# Patient Record
Sex: Female | Born: 1985 | Race: White | Hispanic: No | Smoking: Current every day smoker
Health system: Southern US, Community
[De-identification: ages and names within clinical notes are randomized; demographics above are authoritative.]

## PROBLEM LIST (undated history)

## (undated) ENCOUNTER — Inpatient Hospital Stay (HOSPITAL_COMMUNITY): Payer: Self-pay

## (undated) DIAGNOSIS — F419 Anxiety disorder, unspecified: Secondary | ICD-10-CM

## (undated) DIAGNOSIS — R519 Headache, unspecified: Secondary | ICD-10-CM

## (undated) DIAGNOSIS — F329 Major depressive disorder, single episode, unspecified: Secondary | ICD-10-CM

## (undated) DIAGNOSIS — B999 Unspecified infectious disease: Secondary | ICD-10-CM

## (undated) DIAGNOSIS — R87629 Unspecified abnormal cytological findings in specimens from vagina: Secondary | ICD-10-CM

## (undated) DIAGNOSIS — R87619 Unspecified abnormal cytological findings in specimens from cervix uteri: Secondary | ICD-10-CM

## (undated) DIAGNOSIS — IMO0002 Reserved for concepts with insufficient information to code with codable children: Secondary | ICD-10-CM

## (undated) DIAGNOSIS — R51 Headache: Secondary | ICD-10-CM

## (undated) DIAGNOSIS — F32A Depression, unspecified: Secondary | ICD-10-CM

## (undated) HISTORY — PX: WISDOM TOOTH EXTRACTION: SHX21

## (undated) HISTORY — DX: Unspecified abnormal cytological findings in specimens from cervix uteri: R87.619

## (undated) HISTORY — DX: Reserved for concepts with insufficient information to code with codable children: IMO0002

## (undated) HISTORY — PX: INDUCED ABORTION: SHX677

---
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
## 2005-04-13 ENCOUNTER — Other Ambulatory Visit: Admission: RE | Admit: 2005-04-13 | Discharge: 2005-04-13 | Payer: Self-pay | Admitting: Obstetrics and Gynecology

## 2005-05-19 ENCOUNTER — Emergency Department (HOSPITAL_COMMUNITY): Admission: EM | Admit: 2005-05-19 | Discharge: 2005-05-19 | Payer: Self-pay | Admitting: Emergency Medicine

## 2006-06-24 ENCOUNTER — Emergency Department (HOSPITAL_COMMUNITY): Admission: EM | Admit: 2006-06-24 | Discharge: 2006-06-24 | Payer: Self-pay | Admitting: Emergency Medicine

## 2007-07-04 ENCOUNTER — Other Ambulatory Visit: Admission: RE | Admit: 2007-07-04 | Discharge: 2007-07-04 | Payer: Self-pay | Admitting: Obstetrics and Gynecology

## 2007-12-29 ENCOUNTER — Emergency Department (HOSPITAL_COMMUNITY): Admission: EM | Admit: 2007-12-29 | Discharge: 2007-12-29 | Payer: Self-pay | Admitting: Emergency Medicine

## 2008-10-02 ENCOUNTER — Inpatient Hospital Stay (HOSPITAL_COMMUNITY): Admission: AD | Admit: 2008-10-02 | Discharge: 2008-10-02 | Payer: Self-pay | Admitting: Obstetrics & Gynecology

## 2008-10-09 ENCOUNTER — Other Ambulatory Visit: Admission: RE | Admit: 2008-10-09 | Discharge: 2008-10-09 | Payer: Self-pay | Admitting: Obstetrics and Gynecology

## 2009-02-01 ENCOUNTER — Inpatient Hospital Stay (HOSPITAL_COMMUNITY): Admission: AD | Admit: 2009-02-01 | Discharge: 2009-02-01 | Payer: Self-pay | Admitting: Obstetrics and Gynecology

## 2009-04-29 ENCOUNTER — Inpatient Hospital Stay (HOSPITAL_COMMUNITY): Admission: AD | Admit: 2009-04-29 | Discharge: 2009-05-03 | Payer: Self-pay | Admitting: Obstetrics and Gynecology

## 2009-09-28 ENCOUNTER — Emergency Department (HOSPITAL_COMMUNITY): Admission: EM | Admit: 2009-09-28 | Discharge: 2009-09-28 | Payer: Self-pay | Admitting: Emergency Medicine

## 2010-12-15 ENCOUNTER — Emergency Department (HOSPITAL_COMMUNITY)
Admission: EM | Admit: 2010-12-15 | Discharge: 2010-12-15 | Disposition: A | Discharge status: Home / Self Care | Payer: Self-pay | Attending: Emergency Medicine | Admitting: Emergency Medicine

## 2010-12-15 DIAGNOSIS — J069 Acute upper respiratory infection, unspecified: Secondary | ICD-10-CM | POA: Insufficient documentation

## 2010-12-15 DIAGNOSIS — J309 Allergic rhinitis, unspecified: Secondary | ICD-10-CM | POA: Insufficient documentation

## 2010-12-26 LAB — CBC
Hemoglobin: 9.9 g/dL — ABNORMAL LOW (ref 12.0–15.0)
MCHC: 34.2 g/dL (ref 30.0–36.0)
MCHC: 35 g/dL (ref 30.0–36.0)
MCV: 94.6 fL (ref 78.0–100.0)
Platelets: 184 10*3/uL (ref 150–400)
RBC: 2.99 MIL/uL — ABNORMAL LOW (ref 3.87–5.11)
RBC: 3.91 MIL/uL (ref 3.87–5.11)
RDW: 13.1 % (ref 11.5–15.5)

## 2010-12-29 LAB — URINALYSIS, ROUTINE W REFLEX MICROSCOPIC
Bilirubin Urine: NEGATIVE
Glucose, UA: NEGATIVE mg/dL
Ketones, ur: NEGATIVE mg/dL
pH: 6 (ref 5.0–8.0)

## 2011-01-04 LAB — URINALYSIS, ROUTINE W REFLEX MICROSCOPIC
Glucose, UA: NEGATIVE mg/dL
Hgb urine dipstick: NEGATIVE
Specific Gravity, Urine: 1.02 (ref 1.005–1.030)
Urobilinogen, UA: 0.2 mg/dL (ref 0.0–1.0)

## 2011-02-02 NOTE — H&P (Signed)
Selena Wang, Selena Wang         ACCOUNT NO.:  [id number]   MEDICAL RECORD NO.:  [id number]          PATIENT TYPE:  INP   LOCATION:  9172                          FACILITY:  [facility]   PHYSICIAN:  Gerald Leitz, MD          DATE OF BIRTH:  09-09-1986   DATE OF ADMISSION:  04/29/2009  DATE OF DISCHARGE:                              HISTORY & PHYSICAL   The patient scheduled for admission on April 29, 2009.   HISTORY OF PRESENT ILLNESS:  This is a 25 year old G2, P 0-0-1-0 at 40  weeks 6 days.  Intrauterine pregnancy based on 12-week ultrasound with  estimated date of delivery April 23, 2009.  Pregnancy was complicated by  tobacco use and depression and anxiety.  She reports positive fetal  movement.  No leakage of fluid.  No vaginal bleeding.  No irregular  contractions.   PAST MEDICAL HISTORY:  Depression and anxiety.   PAST SURGICAL HISTORY:  D&C in 2006 for elective AB.   PAST OB HISTORY:  Elective AB in 2006.   MEDICATIONS:  Prenatal vitamins and Zoloft.   ALLERGIES:  ALLEGRA-D, which causes palpitations.   PAST GYN HISTORY:  ASCUS Pap in January 2010.  Positive HPV.   SOCIAL HISTORY:  The patient is married.  Positive tobacco use,  approximately half pack per day.  Denies alcohol or illicit drug use.   REVIEW OF SYSTEMS:  Negative except as stated history of current  illness.   PHYSICAL EXAMINATION:  VITAL SIGNS:  Blood pressure 120/70, weight 176  pounds.  GENERAL:  Alert and oriented no acute distress.  CARDIOVASCULAR:  Regular rate and rhythm.  LUNGS:  Clear to auscultation bilaterally.  ABDOMEN:  Gravid, nontender.  EXTREMITIES:  1+ edema bilaterally.  Cervix closed.  Fetal heart rate 148.   LABS:  Blood type O+, GBS is negative.  Gonorrhea, Chlamydia negative,  HIV is nonreactive.  RPR nonreactive.  Glucola 87.  Rubella immune.   ASSESSMENT/PLAN:  A 40-week 6-day intrauterine pregnancy, post dates for  Cytotec induction.  GBS negative.  Anticipate spontaneous  vaginal  delivery.      Gerald Leitz, MD  Electronically Signed     TC/MEDQ  D:  04/29/2009  T:  04/29/2009  Job:  [id number]

## 2011-06-15 LAB — COMPREHENSIVE METABOLIC PANEL
ALT: 13
AST: 30
Alkaline Phosphatase: 68
GFR calc Af Amer: >60
Glucose, Bld: 81
Potassium: 4
Sodium: 136
Total Protein: 6.8

## 2011-06-15 LAB — POCT PREGNANCY, URINE
Operator id: 234501
Preg Test, Ur: NEGATIVE

## 2011-06-15 LAB — URINALYSIS, ROUTINE W REFLEX MICROSCOPIC
Bilirubin Urine: NEGATIVE
Glucose, UA: NEGATIVE
Hgb urine dipstick: NEGATIVE
Ketones, ur: NEGATIVE
Protein, ur: NEGATIVE
pH: 7

## 2011-06-15 LAB — CBC
Hemoglobin: 14.1
RBC: 4.56
RDW: 12

## 2011-06-15 LAB — DIFFERENTIAL
Basophils Relative: 0
Eosinophils Absolute: 0.2
Eosinophils Relative: 2
Monocytes Absolute: 0.6
Monocytes Relative: 8
Neutrophils Relative %: 54

## 2011-07-13 ENCOUNTER — Emergency Department (HOSPITAL_COMMUNITY)
Admission: EM | Admit: 2011-07-13 | Discharge: 2011-07-14 | Disposition: A | Discharge status: Home / Self Care | Payer: Self-pay | Attending: Emergency Medicine | Admitting: Emergency Medicine

## 2011-07-13 DIAGNOSIS — L723 Sebaceous cyst: Secondary | ICD-10-CM | POA: Insufficient documentation

## 2011-07-13 DIAGNOSIS — F17200 Nicotine dependence, unspecified, uncomplicated: Secondary | ICD-10-CM | POA: Insufficient documentation

## 2011-07-13 DIAGNOSIS — L708 Other acne: Secondary | ICD-10-CM | POA: Insufficient documentation

## 2012-03-06 ENCOUNTER — Encounter (HOSPITAL_COMMUNITY): Payer: Self-pay | Admitting: Emergency Medicine

## 2012-03-06 ENCOUNTER — Emergency Department (HOSPITAL_COMMUNITY)
Admission: EM | Admit: 2012-03-06 | Discharge: 2012-03-06 | Disposition: A | Discharge status: Home / Self Care | Payer: Self-pay | Attending: Emergency Medicine | Admitting: Emergency Medicine

## 2012-03-06 DIAGNOSIS — N390 Urinary tract infection, site not specified: Secondary | ICD-10-CM | POA: Insufficient documentation

## 2012-03-06 DIAGNOSIS — F17200 Nicotine dependence, unspecified, uncomplicated: Secondary | ICD-10-CM | POA: Insufficient documentation

## 2012-03-06 LAB — URINALYSIS, ROUTINE W REFLEX MICROSCOPIC
Bilirubin Urine: NEGATIVE
Glucose, UA: NEGATIVE mg/dL
Ketones, ur: NEGATIVE mg/dL
Specific Gravity, Urine: 1.013 (ref 1.005–1.030)
pH: 6.5 (ref 5.0–8.0)

## 2012-03-06 LAB — URINE MICROSCOPIC-ADD ON

## 2012-03-06 MED ORDER — NITROFURANTOIN MONOHYD MACRO 100 MG PO CAPS: 100.0000 mg | ORAL_CAPSULE | Freq: Two times a day (BID) | ORAL | Status: AC

## 2012-03-06 NOTE — ED Notes (Signed)
Pt presenting to ed with c/o lower abdominal pain and dysuria. Pt states urinary urgency and frequency. Pt states she took over the counter medicine on Friday and Saturday and had some relief but now the pain is back . Pt states she had her period one week ago and today she is seeing a small amount of blood when she wipes

## 2012-03-06 NOTE — Discharge Instructions (Signed)

## 2012-03-06 NOTE — ED Provider Notes (Signed)
History     CSN: [id number]  Arrival date & time 03/06/12  1224   First MD Initiated Contact with Patient 03/06/12 1648      Chief Complaint  Patient presents with  . Dysuria    (Consider location/radiation/quality/duration/timing/severity/associated sxs/prior treatment) Patient is a 26 y.o. female presenting with dysuria. The history is provided by the patient.  Dysuria  This is a new problem. Episode onset: 5 days ago. The problem occurs every urination. The problem has been gradually worsening. The quality of the pain is described as burning. There has been no fever. There is no history of pyelonephritis. Associated symptoms include frequency and hematuria. Pertinent negatives include no chills, no nausea, no vomiting, no discharge, no possible pregnancy, no urgency and no flank pain. Treatments tried: OTC Urostat. Her past medical history does not include kidney stones or recurrent UTIs.    History reviewed. No pertinent past medical history.  History reviewed. No pertinent past surgical history.  No family history on file.  History  Substance Use Topics  . Smoking status: Current Everyday Smoker -- 1.0 packs/day  . Smokeless tobacco: Not on file  . Alcohol Use: Yes     occassion    OB History    Grav Para Term Preterm Abortions TAB SAB Ect Mult Living                  Review of Systems  Constitutional: Negative for fever and chills.  Gastrointestinal: Negative for nausea and vomiting.  Genitourinary: Positive for dysuria, frequency and hematuria. Negative for urgency, flank pain, decreased urine volume, vaginal bleeding, vaginal discharge, difficulty urinating and vaginal pain.  Neurological: Negative for dizziness, syncope and light-headedness.  Psychiatric/Behavioral: Negative for confusion.    Allergies  Review of patient's allergies indicates no known allergies.  Home Medications   Current Outpatient Rx  Name Route Sig Dispense Refill  . ADULT  MULTIVITAMIN W/MINERALS CH Oral Take 1 tablet by mouth daily.      BP 109/78  Pulse 98  Temp 98.5 F (36.9 C) (Oral)  Resp 15  SpO2 100%  LMP 02/26/2012  Physical Exam  Nursing note and vitals reviewed. Constitutional: She appears well-developed and well-nourished. No distress.  HENT:  Head: Normocephalic and atraumatic.  Mouth/Throat: Oropharynx is clear and moist.  Neck: Normal range of motion. Neck supple.  Cardiovascular: Normal rate, regular rhythm and normal heart sounds.   Pulmonary/Chest: Effort normal and breath sounds normal.  Abdominal: Soft. Bowel sounds are normal. She exhibits no mass. There is no rigidity, no rebound, no guarding, no CVA tenderness, no tenderness at McBurney's point and negative Murphy's sign.       Mild suprapubic tenderness to palpation  Neurological: She is alert.  Skin: Skin is warm and dry. She is not diaphoretic.  Psychiatric: She has a normal mood and affect.    ED Course  Procedures (including critical care time)  Labs Reviewed  URINALYSIS, ROUTINE W REFLEX MICROSCOPIC - Abnormal; Notable for the following:    APPearance CLOUDY (*)     Hgb urine dipstick LARGE (*)     Leukocytes, UA LARGE (*)     All other components within normal limits  POCT PREGNANCY, URINE  URINE MICROSCOPIC-ADD ON   No results found.   No diagnosis found.    MDM  Patient presenting with dysuria, increased urinary frequency, and mild suprapubic abdominal tenderness.  Patient afebrile.  No CVA tenderness.  UA demonstrates UTI.  Patient given antibiotic prescription.  Return precautions  discussed.          Pascal Lux [place], PA-C 03/06/12 2256

## 2012-03-06 NOTE — ED Provider Notes (Signed)
Medical screening examination/treatment/procedure(s) were performed by non-physician practitioner and as supervising physician I was immediately available for consultation/collaboration.   Keegan Bensch, MD 03/06/12 2351 

## 2012-03-06 NOTE — ED Notes (Signed)
Pt c/o urinary tract infection symptoms. States that she is having lower abdominal pain, pain upon urination, and bleeding when she wipes. States LMP was 02/26/12. Denies nausea/vomiting and fever.

## 2012-03-08 LAB — URINE CULTURE

## 2012-03-09 NOTE — ED Notes (Signed)
+   Urine Patient treated with Macrobid to same-chart appended per protocol MD. 

## 2013-02-15 ENCOUNTER — Inpatient Hospital Stay (HOSPITAL_COMMUNITY)
Admission: AD | Admit: 2013-02-15 | Discharge: 2013-02-16 | Disposition: A | Discharge status: Home / Self Care | Payer: Self-pay | Source: Ambulatory Visit | Attending: Family Medicine | Admitting: Family Medicine

## 2013-02-15 ENCOUNTER — Inpatient Hospital Stay (HOSPITAL_COMMUNITY): Payer: Self-pay

## 2013-02-15 ENCOUNTER — Encounter (HOSPITAL_COMMUNITY): Payer: Self-pay | Admitting: *Deleted

## 2013-02-15 DIAGNOSIS — O209 Hemorrhage in early pregnancy, unspecified: Secondary | ICD-10-CM | POA: Insufficient documentation

## 2013-02-15 DIAGNOSIS — R109 Unspecified abdominal pain: Secondary | ICD-10-CM | POA: Insufficient documentation

## 2013-02-15 LAB — CBC
HCT: 40.9 % (ref 36.0–46.0)
Hemoglobin: 14.5 g/dL (ref 12.0–15.0)
MCHC: 35.5 g/dL (ref 30.0–36.0)
MCV: 90.5 fL (ref 78.0–100.0)
RDW: 12.6 % (ref 11.5–15.5)

## 2013-02-15 LAB — POCT PREGNANCY, URINE: Preg Test, Ur: POSITIVE — AB

## 2013-02-15 NOTE — MAU Note (Signed)
Pt states she had a positive pregnancy test, and bloody mucousnoted prior to her coming her tonight Monday morning pain  was horrible.

## 2013-02-15 NOTE — MAU Note (Signed)
Pt LMP 01/13/2013, +UPT at home, abd pain on Monday, today passing bloody mucous.  Taking pain medication and abx for facial abcess removal.

## 2013-02-16 DIAGNOSIS — O209 Hemorrhage in early pregnancy, unspecified: Secondary | ICD-10-CM

## 2013-02-16 LAB — WET PREP, GENITAL
Clue Cells Wet Prep HPF POC: NONE SEEN
Yeast Wet Prep HPF POC: NONE SEEN

## 2013-02-16 NOTE — MAU Provider Note (Signed)
History     CSN: [id number]  Arrival date and time: 02/15/13 1955   None     Chief Complaint  Patient presents with  . Possible Pregnancy  . Abdominal Pain  . Vaginal Bleeding   HPI This is a 27 y.o. female at Unknown Gestation who presents with c/o blood stained mucous. Has had some pelvic cramping that comes and goes. Denies nausea or vomiting. Denies fever  RN Note: Pt states she had a positive pregnancy test, and bloody mucousnoted prior to her coming her tonight Monday morning pain was horrible.      OB History   Grav Para Term Preterm Abortions TAB SAB Ect Mult Living   3 1 1  0 0 0 0 0 0 1      Past Medical History  Diagnosis Date  . Medical history non-contributory     Past Surgical History  Procedure Laterality Date  . No past surgeries      Family History  Problem Relation Age of Onset  . Depression Mother   . Thyroid disease Mother   . Hypertension Father     History  Substance Use Topics  . Smoking status: Current Every Day Smoker -- 1.00 packs/day  . Smokeless tobacco: Not on file  . Alcohol Use: Yes     Comment: occassion    Allergies: No Known Allergies  Prescriptions prior to admission  Medication Sig Dispense Refill  . loratadine-pseudoephedrine (CLARITIN-D 24-HOUR) 10-240 MG per 24 hr tablet Take 1 tablet by mouth daily as needed for allergies.      . naproxen (NAPROSYN) 500 MG tablet Take 500 mg by mouth 2 (two) times daily.      . Naproxen Sodium (ALEVE) 220 MG CAPS Take 2 capsules by mouth 2 (two) times daily.      Marland Kitchen sulfamethoxazole-trimethoprim (BACTRIM DS) 800-160 MG per tablet Take 1 tablet by mouth 2 (two) times daily.      . traMADol (ULTRAM) 50 MG tablet Take 50 mg by mouth every 6 (six) hours as needed for pain.        Review of Systems  Constitutional: Negative for fever, chills and malaise/fatigue.  Gastrointestinal: Positive for abdominal pain (previously, not now). Negative for nausea, vomiting, diarrhea and  constipation.  Genitourinary: Negative for dysuria.  Neurological: Negative for dizziness.   Physical Exam   Blood pressure 119/77, pulse 88, temperature 98.3 F (36.8 C), temperature source Oral, resp. rate 16, height 5\' 2"  (1.575 m), weight 68.856 kg (151 lb 12.8 oz), last menstrual period 01/13/2013.  Physical Exam  Constitutional: She is oriented to person, place, and time. She appears well-developed and well-nourished. No distress.  HENT:  Head: Normocephalic.  Cardiovascular: Normal rate.   Respiratory: Effort normal.  GI: Soft. She exhibits no distension. There is no tenderness. There is no rebound and no guarding.  Genitourinary: Vagina normal and uterus normal. No vaginal discharge (no blood seen) found.  Musculoskeletal: Normal range of motion.  Neurological: She is alert and oriented to person, place, and time.  Skin: Skin is warm and dry.  Psychiatric: She has a normal mood and affect.   Results for orders placed during the hospital encounter of 02/15/13 (from the past 24 hour(s))  POCT PREGNANCY, URINE     Status: Abnormal   Collection Time    02/15/13  9:17 PM      Result Value Range   Preg Test, Ur POSITIVE (*) NEGATIVE  HCG, QUANTITATIVE, PREGNANCY     Status: Abnormal  Collection Time    02/15/13  9:45 PM      Result Value Range   hCG, Beta Chain, Quant, S 1460 (*) <5 mIU/mL  ABO/RH     Status: None   Collection Time    02/15/13  9:45 PM      Result Value Range   ABO/RH(D) O POS    CBC     Status: None   Collection Time    02/15/13  9:45 PM      Result Value Range   WBC 9.3  4.0 - 10.5 K/uL   RBC 4.52  3.87 - 5.11 MIL/uL   Hemoglobin 14.5  12.0 - 15.0 g/dL   HCT 16.1  09.6 - 04.5 %   MCV 90.5  78.0 - 100.0 fL   MCH 32.1  26.0 - 34.0 pg   MCHC 35.5  30.0 - 36.0 g/dL   RDW 40.9  81.1 - 91.4 %   Platelets 197  150 - 400 K/uL  WET PREP, GENITAL     Status: Abnormal   Collection Time    02/16/13 12:26 AM      Result Value Range   Yeast Wet Prep HPF  POC NONE SEEN  NONE SEEN   Trich, Wet Prep NONE SEEN  NONE SEEN   Clue Cells Wet Prep HPF POC NONE SEEN  NONE SEEN   WBC, Wet Prep HPF POC FEW (*) NONE SEEN     MAU Course  Procedures  MDM US Ob Transvaginal  02/15/2013   *RADIOLOGY REPORT*  Clinical Data: Vaginal bleeding and pelvic pain.  OBSTETRIC <14 WK [place] AND TRANSVAGINAL OB US  Technique:  Both transabdominal and transvaginal ultrasound examinations were performed for complete evaluation of the gestation as well as the maternal uterus, adnexal regions, and pelvic cul-de-sac.  Transvaginal technique was performed to assess early pregnancy.  Comparison:  None.  Intrauterine gestational sac:  Visualized/normal in shape. Yolk sac: Yes Embryo: No Cardiac Activity: N/A  MSD: 3.9 mm  4 w 6 d  Maternal uterus/adnexae: No subchorionic hemorrhage is seen.  The uterus is otherwise unremarkable in appearance.  The ovaries are within normal limits.  The right ovary measures 3.9 x 2.2 x 3.6 cm, while the left ovary measures 2.9 x 2.1 x 1.7 cm. No suspicious adnexal masses are seen.  There is no evidence for ovarian torsion.  No free fluid is seen in the pelvic cul-de-sac.  IMPRESSION: Single intrauterine gestational sac seen, with a mean sac diameter of 0.4 cm, corresponding to a gestational age of [redacted] weeks 6 days. This matches the gestational age of [redacted] weeks 5 days by LMP, reflecting an estimated date of delivery of October 20, 2013.  The embryo is not yet visible.   Original Report Authenticated By: Tonia Ghent, M.D.    Assessment and Plan  A:  SIUP with yolk sac seen      First trimester bleeding     P:  Discharge home       Cultures sent       Encouraged to seek Prenatal care       Proof of pregnancy letter given  [facility] 02/16/2013, 12:32 AM

## 2013-02-16 NOTE — MAU Provider Note (Signed)
Chart reviewed and agree with management and plan.  

## 2013-03-24 ENCOUNTER — Encounter (HOSPITAL_COMMUNITY): Payer: Self-pay | Admitting: *Deleted

## 2013-03-24 ENCOUNTER — Inpatient Hospital Stay (HOSPITAL_COMMUNITY): Payer: Medicaid Other

## 2013-03-24 ENCOUNTER — Inpatient Hospital Stay (HOSPITAL_COMMUNITY)
Admission: AD | Admit: 2013-03-24 | Discharge: 2013-03-24 | Disposition: A | Discharge status: Home / Self Care | Payer: Medicaid Other | Source: Ambulatory Visit | Attending: Obstetrics & Gynecology | Admitting: Obstetrics & Gynecology

## 2013-03-24 DIAGNOSIS — O035 Genital tract and pelvic infection following complete or unspecified spontaneous abortion: Secondary | ICD-10-CM | POA: Insufficient documentation

## 2013-03-24 DIAGNOSIS — O039 Complete or unspecified spontaneous abortion without complication: Secondary | ICD-10-CM

## 2013-03-24 LAB — COMPREHENSIVE METABOLIC PANEL
AST: 15 U/L (ref 0–37)
BUN: 8 mg/dL (ref 6–23)
CO2: 26 mEq/L (ref 19–32)
Calcium: 10.2 mg/dL (ref 8.4–10.5)
Chloride: 103 mEq/L (ref 96–112)
Creatinine, Ser: 0.54 mg/dL (ref 0.50–1.10)
GFR calc Af Amer: >90 mL/min (ref >90)
GFR calc non Af Amer: >90 mL/min (ref >90)
Glucose, Bld: 81 mg/dL (ref 70–99)
Total Bilirubin: 0.5 mg/dL (ref 0.3–1.2)

## 2013-03-24 LAB — CBC
HCT: 40.4 % (ref 36.0–46.0)
MCH: 31.4 pg (ref 26.0–34.0)
MCV: 88.6 fL (ref 78.0–100.0)
Platelets: 200 10*3/uL (ref 150–400)
RBC: 4.56 MIL/uL (ref 3.87–5.11)
WBC: 8 10*3/uL (ref 4.0–10.5)

## 2013-03-24 LAB — URINALYSIS, ROUTINE W REFLEX MICROSCOPIC
Leukocytes, UA: NEGATIVE
Nitrite: NEGATIVE
Protein, ur: NEGATIVE mg/dL
Specific Gravity, Urine: 1.01 (ref 1.005–1.030)
Urobilinogen, UA: 0.2 mg/dL (ref 0.0–1.0)

## 2013-03-24 LAB — WET PREP, GENITAL

## 2013-03-24 LAB — URINE MICROSCOPIC-ADD ON

## 2013-03-24 MED ORDER — IBUPROFEN 800 MG PO TABS: 800.0000 mg | ORAL_TABLET | Freq: Three times a day (TID) | ORAL | Status: DC

## 2013-03-24 MED ORDER — FLUCONAZOLE 150 MG PO TABS
150.0000 mg | ORAL_TABLET | Freq: Once | ORAL | Status: AC
  Administered 2013-03-24: 150 mg via ORAL
  Filled 2013-03-24: qty 1

## 2013-03-24 MED ORDER — PROMETHAZINE HCL 12.5 MG PO TABS: 12.5000 mg | ORAL_TABLET | Freq: Four times a day (QID) | ORAL | Status: DC | PRN

## 2013-03-24 MED ORDER — MISOPROSTOL 200 MCG PO TABS: ORAL_TABLET | ORAL | Status: DC

## 2013-03-24 MED ORDER — OXYCODONE-ACETAMINOPHEN 5-325 MG PO TABS: 1.0000 | ORAL_TABLET | ORAL | Status: DC | PRN

## 2013-03-24 MED ORDER — OXYCODONE-ACETAMINOPHEN 5-325 MG PO TABS
1.0000 | ORAL_TABLET | Freq: Once | ORAL | Status: AC
  Administered 2013-03-24: 1 via ORAL
  Filled 2013-03-24: qty 1

## 2013-03-24 NOTE — MAU Note (Signed)
Pt stated she has been bleeding for 3 days got heavier today. No cramping. No prenatal care yet. Bleeding Stared after intercourse on Thursday.

## 2013-03-24 NOTE — MAU Note (Signed)
Pt reports bleeding since Thursday. This morning pt feeling some cramping rates pain 5/10. Pt has been pt feeling dizzy for the past week.

## 2013-03-24 NOTE — MAU Provider Note (Signed)
History     CSN: [id number]  Arrival date and time: 03/24/13 1107   First Provider Initiated Contact with Patient 03/24/13 1133      Chief Complaint  Patient presents with  . Vaginal Bleeding   HPI Ms. Selena Wang is a 27 y.o. G4P1011 at [redacted]w[redacted]d who presents to MAU today with complaint of vaginal bleeding since Thursday. The patient states that she had intercourse Thursday morning and the bleeding started after that. It has been mostly brown with some red at first. She feels that it is heavier today with some small clots. She is having lower abdominal cramping that she rates at 5/10. She has not taken anything for pain. She denies fever or UTI symptoms.   OB History   Grav Para Term Preterm Abortions TAB SAB Ect Mult Living   4 1 1  0 1 1 0 0 0 1      Past Medical History  Diagnosis Date  . Medical history non-contributory     Past Surgical History  Procedure Laterality Date  . No past surgeries      Family History  Problem Relation Age of Onset  . Depression Mother   . Thyroid disease Mother   . Hypertension Father     History  Substance Use Topics  . Smoking status: Current Every Day Smoker -- 1.00 packs/day  . Smokeless tobacco: Not on file  . Alcohol Use: No     Comment: occassion    Allergies: No Known Allergies  Prescriptions prior to admission  Medication Sig Dispense Refill  . acetaminophen (TYLENOL) 500 MG tablet Take 1,000 mg by mouth every 6 (six) hours as needed for pain.      Marland Kitchen loratadine-pseudoephedrine (CLARITIN-D 24-HOUR) 10-240 MG per 24 hr tablet Take 1 tablet by mouth daily as needed for allergies.      . Prenatal Vit-Fe Fumarate-FA (PRENATAL MULTIVITAMIN) TABS Take 1 tablet by mouth daily at 12 noon.        Review of Systems  Constitutional: Negative for fever and malaise/fatigue.  Gastrointestinal: Positive for abdominal pain. Negative for nausea, vomiting, diarrhea and constipation.  Genitourinary: Negative for dysuria, urgency and  frequency.       Neg - vaginal discharge + vaginal bleeding   Physical Exam   Blood pressure 129/73, pulse 116, temperature 97.9 F (36.6 C), temperature source Oral, resp. rate 18, height 5\' 2"  (1.575 m), weight 145 lb 6.4 oz (65.953 kg), last menstrual period 01/13/2013.  Physical Exam  Constitutional: She is oriented to person, place, and time. She appears well-developed and well-nourished. No distress.  HENT:  Head: Normocephalic and atraumatic.  Cardiovascular: Normal rate, regular rhythm and normal heart sounds.   Respiratory: Effort normal and breath sounds normal. No respiratory distress.  GI: Soft. Bowel sounds are normal. She exhibits no distension and no mass. There is tenderness (mild tenderness to palpation of the lower abdomen). There is no rebound and no guarding.  Neurological: She is alert and oriented to person, place, and time.  Skin: Skin is warm and dry. No erythema.  Psychiatric: She has a normal mood and affect.   Results for orders placed during the hospital encounter of 03/24/13 (from the past 24 hour(s))  WET PREP, GENITAL     Status: Abnormal   Collection Time    03/24/13 11:40 AM      Result Value Range   Yeast Wet Prep HPF POC FEW (*) NONE SEEN   Trich, Wet Prep NONE SEEN  NONE SEEN  Clue Cells Wet Prep HPF POC NONE SEEN  NONE SEEN   WBC, Wet Prep HPF POC FEW (*) NONE SEEN  URINALYSIS, ROUTINE W REFLEX MICROSCOPIC     Status: Abnormal   Collection Time    03/24/13 11:45 AM      Result Value Range   Color, Urine YELLOW  YELLOW   APPearance CLEAR  CLEAR   Specific Gravity, Urine 1.010  1.005 - 1.030   pH 6.5  5.0 - 8.0   Glucose, UA NEGATIVE  NEGATIVE mg/dL   Hgb urine dipstick LARGE (*) NEGATIVE   Bilirubin Urine NEGATIVE  NEGATIVE   Ketones, ur NEGATIVE  NEGATIVE mg/dL   Protein, ur NEGATIVE  NEGATIVE mg/dL   Urobilinogen, UA 0.2  0.0 - 1.0 mg/dL   Nitrite NEGATIVE  NEGATIVE   Leukocytes, UA NEGATIVE  NEGATIVE  URINE MICROSCOPIC-ADD ON      Status: Abnormal   Collection Time    03/24/13 11:45 AM      Result Value Range   Squamous Epithelial / LPF FEW (*) RARE   WBC, UA 0-2  <3 WBC/hpf   RBC / HPF 3-6  <3 RBC/hpf   Bacteria, UA FEW (*) RARE  CBC     Status: None   Collection Time    03/24/13 11:55 AM      Result Value Range   WBC 8.0  4.0 - 10.5 K/uL   RBC 4.56  3.87 - 5.11 MIL/uL   Hemoglobin 14.3  12.0 - 15.0 g/dL   HCT 16.1  09.6 - 04.5 %   MCV 88.6  78.0 - 100.0 fL   MCH 31.4  26.0 - 34.0 pg   MCHC 35.4  30.0 - 36.0 g/dL   RDW 40.9  81.1 - 91.4 %   Platelets 200  150 - 400 K/uL  COMPREHENSIVE METABOLIC PANEL     Status: None   Collection Time    03/24/13 11:55 AM      Result Value Range   Sodium 139  135 - 145 mEq/L   Potassium 4.0  3.5 - 5.1 mEq/L   Chloride 103  96 - 112 mEq/L   CO2 26  19 - 32 mEq/L   Glucose, Bld 81  70 - 99 mg/dL   BUN 8  6 - 23 mg/dL   Creatinine, Ser 7.82  0.50 - 1.10 mg/dL   Calcium 95.6  8.4 - 21.3 mg/dL   Total Protein 7.1  6.0 - 8.3 g/dL   Albumin 4.3  3.5 - 5.2 g/dL   AST 15  0 - 37 U/L   ALT 13  0 - 35 U/L   Alkaline Phosphatase 82  39 - 117 U/L   Total Bilirubin 0.5  0.3 - 1.2 mg/dL   GFR calc non Af Amer >90  >90 mL/min   GFR calc Af Amer >90  >90 mL/min    MAU Course  Procedures None  MDM Wet prep, GC/Chlamydia, UA and [place] today 150 mg Diflucan given in MAU No FHTs today with doppler Discussed patient with Dr. Despina Hidden. Due to long wait time for [place] he will come to MAU and do a bedside.  Dr. Despina Hidden does not see appropriate growth or cardiac activity. Will get official US Discussed [place] results with Dr. Despina Hidden. Ok to offer expectant management vs Cytotec       Early Intrauterine Pregnancy Failure  __x_  Documented intrauterine pregnancy failure less than or equal to [redacted] weeks gestation  __x_  No  serious current illness  __x_  Baseline Hgb greater than or equal to 10g/dl  __x_  Patient has easily accessible transportation to the hospital  __x_  Clear preference  __x_   Practitioner/physician deems patient reliable  __x_  Counseling by practitioner or physician  ___  Patient education by RN  ___  Consent form signed  __N/A_  Rho-Gam given by RN if indicated  _x__ Medication dispensed   _x__   Cytotec 800 mcg  _x_   Intravaginally by patient at home         __   Intravaginally by RN in MAU        __   Rectally by patient at home        __   Rectally by RN in MAU  _x__  Ibuprofen 600 mg 1 tablet by mouth every 6 hours as needed #30  _x__  Hydrocodone/acetaminophen 5/325 mg by mouth every 4 to 6 hours as needed  _x__  Phenergan 12.5 mg by mouth every 4 hours as needed for nausea    Assessment and Plan  A: Yeast vaginitis SAB  P: Discharge home Rx for Cytotec, Ibuprofen, Phenergan and Percocet given Patient referred to GYN clinic at [facility] for follow-up in 2 weeks Bleeding precautions discussed Patient may return to MAU as needed or if her condition were to change or worsen  Freddi Starr, PA-C  03/24/2013, 1:52 PM

## 2013-03-28 ENCOUNTER — Telehealth: Payer: Self-pay | Admitting: General Practice

## 2013-03-28 ENCOUNTER — Inpatient Hospital Stay (HOSPITAL_COMMUNITY)
Admission: AD | Admit: 2013-03-28 | Discharge: 2013-03-29 | Disposition: A | Discharge status: Home / Self Care | Payer: Medicaid Other | Source: Ambulatory Visit | Attending: Obstetrics & Gynecology | Admitting: Obstetrics & Gynecology

## 2013-03-28 ENCOUNTER — Encounter (HOSPITAL_COMMUNITY): Payer: Self-pay

## 2013-03-28 ENCOUNTER — Inpatient Hospital Stay (HOSPITAL_COMMUNITY): Payer: Medicaid Other

## 2013-03-28 DIAGNOSIS — O034 Incomplete spontaneous abortion without complication: Secondary | ICD-10-CM

## 2013-03-28 LAB — CBC
HCT: 35.5 % — ABNORMAL LOW (ref 36.0–46.0)
MCH: 31.2 pg (ref 26.0–34.0)
MCV: 88.5 fL (ref 78.0–100.0)
Platelets: 183 10*3/uL (ref 150–400)
RDW: 12.2 % (ref 11.5–15.5)
WBC: 9.3 10*3/uL (ref 4.0–10.5)

## 2013-03-28 MED ORDER — OXYCODONE-ACETAMINOPHEN 5-325 MG PO TABS
2.0000 | ORAL_TABLET | Freq: Once | ORAL | Status: AC
  Administered 2013-03-28: 2 via ORAL
  Filled 2013-03-28: qty 2

## 2013-03-28 NOTE — Telephone Encounter (Signed)
Called patient back and patient stated she had already talked to someone yesterday and had already miscarried but was still having light cramping and back pain and wanted to know what she could take. Suggested she try the Ibuprofen 800mg  first and that should work. Patient verbalized understanding and asked about a follow up appt. Told patient she needed to come in for blood work to make sure the pregnancy hormone was dropping like it should be and confirmed 7/14 @ 10am appt with patient. Patient verbalized understanding and confirmed appt time. Told patient at that appt we would schedule a follow up appt with a provider. Patient verbalized understanding and had no further questions

## 2013-03-28 NOTE — MAU Provider Note (Signed)
Chief Complaint: Abdominal Pain  First Provider Initiated Contact with Patient 03/28/13 2310     SUBJECTIVE HPI: Selena Wang is a 27 y.o. G4P1021 at [redacted]w[redacted]d by [place] 02/15/13 who presents with moderate to severe cramping, vaginal bleeding and possible passage of tissue. Denies fever, chills, dizziness.  Ultrasound 03/24/2013 showed 6 weeks 1 day intrauterine pregnancy without cardiac activity visualized. An appropriate progression since previous ultrasound. Prescribe Cytotec for failed pregnancy, but patient did not take.  Past Medical History  Diagnosis Date  . Medical history non-contributory    OB History   Grav Para Term Preterm Abortions TAB SAB Ect Mult Living   4 1 1  0 2 1 1  0 0 1     # Outc Date GA Lbr Len/2nd Wgt Sex Del Anes PTL Lv   1 TAB 2006           2 TRM 2010    M    Yes   3 SAB            4 CUR              Past Surgical History  Procedure Laterality Date  . No past surgeries     History   Social History  . Marital Status: Married    Spouse Name: N/A    Number of Children: N/A  . Years of Education: N/A   Occupational History  . Not on file.   Social History Main Topics  . Smoking status: Current Every Day Smoker -- 1.00 packs/day  . Smokeless tobacco: Not on file  . Alcohol Use: No     Comment: occassion  . Drug Use: No  . Sexually Active: Yes   Other Topics Concern  . Not on file   Social History Narrative  . No narrative on file   No current facility-administered medications on file prior to encounter.   Current Outpatient Prescriptions on File Prior to Encounter  Medication Sig Dispense Refill  . acetaminophen (TYLENOL) 500 MG tablet Take 1,000 mg by mouth every 6 (six) hours as needed for pain.      Marland Kitchen ibuprofen (ADVIL,MOTRIN) 800 MG tablet Take 1 tablet (800 mg total) by mouth 3 (three) times daily.  21 tablet  0  . loratadine-pseudoephedrine (CLARITIN-D 24-HOUR) 10-240 MG per 24 hr tablet Take 1 tablet by mouth daily as needed for  allergies.      . misoprostol (CYTOTEC) 200 MCG tablet Place 4 tabs (800 mcg) in the vagina once  4 tablet  0  . Prenatal Vit-Fe Fumarate-FA (PRENATAL MULTIVITAMIN) TABS Take 1 tablet by mouth daily at 12 noon.      . promethazine (PHENERGAN) 12.5 MG tablet Take 1 tablet (12.5 mg total) by mouth every 6 (six) hours as needed for nausea.  30 tablet  0   No Known Allergies  ROS: Pertinent items in HPI  OBJECTIVE Blood pressure 111/69, pulse 94, temperature 99 F (37.2 C), resp. rate 16, height 5\' 2"  (1.575 m), weight 65.772 kg (145 lb), last menstrual period 01/13/2013, SpO2 99.00%. GENERAL: Well-developed, well-nourished female in moderate distress. Very anxious.  HEENT: Normocephalic HEART: normal rate RESP: normal effort ABDOMEN: Soft, non-tender EXTREMITIES: Nontender, no edema NEURO: Alert and oriented SPECULUM EXAM: NEFG, moderate bright red blood noted, cervix clean BIMANUAL: cervix FT; uterus slightly enlarged, no adnexal tenderness or masses  LAB RESULTS Results for orders placed during the hospital encounter of 03/28/13 (from the past 24 hour(s))  CBC     Status: Abnormal  Collection Time    03/28/13 11:05 PM      Result Value Range   WBC 9.3  4.0 - 10.5 K/uL   RBC 4.01  3.87 - 5.11 MIL/uL   Hemoglobin 12.5  12.0 - 15.0 g/dL   HCT 16.1 (*) 09.6 - 04.5 %   MCV 88.5  78.0 - 100.0 fL   MCH 31.2  26.0 - 34.0 pg   MCHC 35.2  30.0 - 36.0 g/dL   RDW 40.9  81.1 - 91.4 %   Platelets 183  150 - 400 K/uL    IMAGING US Ob Transvaginal  03/28/2013   *RADIOLOGY REPORT*  Clinical Data: Severe pain after spontaneous abortion  TRANSVAGINAL OB ULTRASOUND  Technique:  Transvaginal ultrasound was performed for evaluation of the gestation as well as the maternal uterus and adnexal regions.  Comparison: 03/24/2013  Findings: No intrauterine gestational sac is currently visualized. Endometrium is thickened and heterogeneous.  Blood flow is noted within the thickened endometrium within the  fundus suggesting retained products of conception.  Blood clot noted within the endometrium in the lower uterine segment/cervix region.  Ovaries are symmetric in size and echotexture.  No adnexal masses. No free fluid.  IMPRESSION: No intrauterine gestational sac currently visualized.  Thickened, heterogeneous endometrium.  Blood flows seen within the endometrium in the fundus suggesting retained products conception.   Original Report Authenticated By: Charlett Nose, M.D.   MAU COURSE Reviewed ultrasound results with Dr. Despina Hidden who does not agree to the ultrasound shows true routine products of conception but rather residual decidua. Offer Cytotec. Patient refuses.  ASSESSMENT 1.  Miscarriage, Possibly Incomplete     PLAN Discharge home in stable condition. Support given. Bleeding precautions.     Follow-up Information   Follow up with [facility] On 04/02/2013.   Contact information:   [address] [place] [address] [phone]      Follow up with [facility]. (As needed if symptoms worsen)    Contact information:   [address] [id number] [place] [address] [phone]       Medication List         acetaminophen 500 MG tablet  Commonly known as:  TYLENOL  Take 1,000 mg by mouth every 6 (six) hours as needed for pain.     ibuprofen 800 MG tablet  Commonly known as:  ADVIL,MOTRIN  Take 1 tablet (800 mg total) by mouth 3 (three) times daily.     loratadine-pseudoephedrine 10-240 MG per 24 hr tablet  Commonly known as:  CLARITIN-D 24-hour  Take 1 tablet by mouth daily as needed for allergies.     misoprostol 200 MCG tablet  Commonly known as:  CYTOTEC  Place 4 tabs (800 mcg) in the vagina once     oxyCODONE-acetaminophen 5-325 MG per tablet  Commonly known as:  PERCOCET/ROXICET  Take 1-2 tablets by mouth every 4 (four) hours as needed for pain.     prenatal multivitamin Tabs  Take 1 tablet  by mouth daily at 12 noon.     promethazine 12.5 MG tablet  Commonly known as:  PHENERGAN  Take 1 tablet (12.5 mg total) by mouth every 6 (six) hours as needed for nausea.        [place], CNM 03/29/2013  12:54 AM

## 2013-03-28 NOTE — Telephone Encounter (Signed)
Patient's boyfriend called and left message stating Selena Wang went to the ER on Saturday night where they confirmed a miscarriage and she was given 3 prescriptions and the pain medication Rx she was given she is almost out of and she's in bed right now with really bad contractions and is bleeding pretty bad and she would like to see about getting medicine refilled.

## 2013-03-28 NOTE — MAU Note (Signed)
Pt reports she was seen in MAU on 07/05, started having bleeding and pain then. States she miscarried yesterday. Has had severe pain all day today.

## 2013-03-28 NOTE — MAU Note (Signed)
Patient states that her vaginal bleeding is mainly in the toilet passing clots ran out of the Percocet which was helping with the pain, pain is constant.

## 2013-03-29 DIAGNOSIS — O034 Incomplete spontaneous abortion without complication: Secondary | ICD-10-CM

## 2013-03-29 MED ORDER — OXYCODONE-ACETAMINOPHEN 5-325 MG PO TABS: 1.0000 | ORAL_TABLET | ORAL | Status: DC | PRN

## 2013-04-02 ENCOUNTER — Other Ambulatory Visit: Payer: Medicaid Other

## 2013-04-02 DIAGNOSIS — O039 Complete or unspecified spontaneous abortion without complication: Secondary | ICD-10-CM

## 2013-04-03 LAB — HCG, QUANTITATIVE, PREGNANCY: hCG, Beta Chain, Quant, S: 53.9 m[IU]/mL

## 2013-04-20 ENCOUNTER — Ambulatory Visit: Payer: Medicaid Other | Admitting: Medical

## 2013-05-09 ENCOUNTER — Encounter: Payer: Self-pay | Admitting: Family Medicine

## 2013-05-09 ENCOUNTER — Other Ambulatory Visit (HOSPITAL_COMMUNITY)
Admission: RE | Admit: 2013-05-09 | Discharge: 2013-05-09 | Disposition: A | Discharge status: Home / Self Care | Payer: Medicaid Other | Source: Ambulatory Visit | Attending: Family Medicine | Admitting: Family Medicine

## 2013-05-09 ENCOUNTER — Ambulatory Visit (INDEPENDENT_AMBULATORY_CARE_PROVIDER_SITE_OTHER): Payer: Medicaid Other | Admitting: Family Medicine

## 2013-05-09 VITALS — BP 104/74 | HR 80 | Temp 97.7°F | Resp 20 | Ht 62.0 in | Wt 147.9 lb

## 2013-05-09 DIAGNOSIS — Z01419 Encounter for gynecological examination (general) (routine) without abnormal findings: Secondary | ICD-10-CM | POA: Insufficient documentation

## 2013-05-09 DIAGNOSIS — O039 Complete or unspecified spontaneous abortion without complication: Secondary | ICD-10-CM

## 2013-05-09 DIAGNOSIS — Z309 Encounter for contraceptive management, unspecified: Secondary | ICD-10-CM

## 2013-05-09 DIAGNOSIS — Z113 Encounter for screening for infections with a predominantly sexual mode of transmission: Secondary | ICD-10-CM | POA: Insufficient documentation

## 2013-05-09 MED ORDER — NORGESTIMATE-ETH ESTRADIOL 0.25-35 MG-MCG PO TABS: 1.0000 | ORAL_TABLET | Freq: Every day | ORAL | Status: DC

## 2013-05-09 NOTE — Progress Notes (Signed)
Subjective:     Patient ID: Selena Wang, female   DOB: Aug 21, 1986, 27 y.o.   MRN: [id number]  HPI  27 yo [id number] here for f/u of SAB  - was seen in the MAU 7/9 for bleeding, pain and passing tissue and concern for SAB - [place] 03/28/13 showed No intrauterine gestational sac currently visualized. Thickened, heterogeneous endometrium. Blood flows seen within the endometrium in the fundus suggesting retained products conception. Reviewed by Dr. Despina Hidden and felt to be more decidua than retained products. Was offered cytotec at this time but pt declined - states she thinks she passed the actual fetus the next day- looked like a baby - bleeding then quickly subsided.  - beta hCG had diminished at time of MAU visit  - since then has been doing well - bleeding subsided within 2 days.  - then had a normal cycle on 04/26/13 - although was slightly heavier - same duration  Since then has done well.  States has come to grips with the miscarriage.  Wants to discuss contraception Sexually active with one partner- no concerns for stds but would like to be checked for gc/chl to be safe.  Also would like PAP smear as had not had one in >3 years.   No fevers chills, weight loss, headaches, cp, sob, vaginal discharge.      Review of Systems See above    Objective:   Physical Exam BP 104/74  Pulse 80  Temp(Src) 97.7 F (36.5 C) (Oral)  Resp 20  Ht 5\' 2"  (1.575 m)  Wt 147 lb 14.4 oz (67.087 kg)  BMI 27.04 kg/m2  LMP 04/26/2013  Breastfeeding? Unknown GENERAL: Well-developed, well-nourished female in no acute distress.  ABDOMEN: Soft, nontender, nondistended. No organomegaly. PELVIC: Normal external female genitalia. Vagina is pink and rugated.  Normal discharge. Normal cervix contour- os closed. No vaginal bleeding. Pap smear obtained. Uterus is normal in size. No adnexal mass or tenderness.  EXTREMITIES: No cyanosis, clubbing, or edema, 2+ distal pulses.     Assessment:     Routine  gynecological examination - Plan: Cytology - PAP  Contraception management  Spontaneous abortion in first trimester      Plan:     1) SAB -now complete - os closed - normal cycles again - pt doing well emotionally  2) routine gyn - PAP obtained - gc/chl also   3) contraception Reviewed all forms of birth control options available including abstinence; over the counter/barrier methods; hormonal contraceptive medication including pill, patch, ring, Depo-Provera injection, Nexplanon; Mirena and Paragard IUDs;  Risks and benefits reviewed.  Questions were answered.  - pt requested OCPs - rx provided - she will start with next period - slight increased risk of blood clot due to smoking discussed.   F/u in 3 months if problems with OCP or one year otherwise.

## 2013-05-09 NOTE — Patient Instructions (Signed)

## 2013-05-24 ENCOUNTER — Telehealth: Payer: Self-pay | Admitting: General Practice

## 2013-05-24 NOTE — Telephone Encounter (Signed)
Message copied by Kathee Delton on Thu May 24, 2013  9:04 AM ------      Message from: Odelia Gage A      Created: Thu May 24, 2013  7:45 AM        Her appointment is 10/16/@ 1:15            Thanks      ----- Message -----         From: Faith Rogue Rash, LPN         Sent: 05/15/2013   8:52 AM           To: [facility] Admin Pool                        ----- Message -----         From: Vale Haven, MD         Sent: 05/14/2013  10:46 AM           To: [facility] Clinical Pool            Pt needs colposcopy.  Ladies, will you call her and schedule this? Thanks!       ------

## 2013-05-24 NOTE — Telephone Encounter (Signed)
Called patient, no answer on mobile and I was unable to leave a message due to voicemail box had not been set up. Called patient at home number and some lady answered stating she was not there at the moment and didn't know a number to reach her.

## 2013-05-25 NOTE — Telephone Encounter (Signed)
Called patient and informed her of results and need for colposcopy and explained procedure. Patient verbalized understanding. Informed patient of 10/16 @ 1:15 appt. Patient verbalized understanding and had no further questions

## 2013-07-05 ENCOUNTER — Encounter: Payer: Self-pay | Admitting: Family Medicine

## 2013-07-05 ENCOUNTER — Ambulatory Visit (INDEPENDENT_AMBULATORY_CARE_PROVIDER_SITE_OTHER): Payer: Medicaid Other | Admitting: Family Medicine

## 2013-07-05 ENCOUNTER — Other Ambulatory Visit (HOSPITAL_COMMUNITY)
Admission: RE | Admit: 2013-07-05 | Discharge: 2013-07-05 | Disposition: A | Discharge status: Home / Self Care | Payer: Medicaid Other | Source: Ambulatory Visit | Attending: Family Medicine | Admitting: Family Medicine

## 2013-07-05 VITALS — BP 122/85 | HR 87 | Temp 98.4°F | Wt 154.5 lb

## 2013-07-05 DIAGNOSIS — IMO0002 Reserved for concepts with insufficient information to code with codable children: Secondary | ICD-10-CM

## 2013-07-05 DIAGNOSIS — Z01812 Encounter for preprocedural laboratory examination: Secondary | ICD-10-CM

## 2013-07-05 DIAGNOSIS — R6889 Other general symptoms and signs: Secondary | ICD-10-CM

## 2013-07-05 DIAGNOSIS — N870 Mild cervical dysplasia: Secondary | ICD-10-CM | POA: Insufficient documentation

## 2013-07-05 NOTE — Patient Instructions (Signed)
Colposcopy  Care After  Colposcopy is a procedure in which a special tool is used to magnify the surface of the cervix. A tissue sample (biopsy) may also be taken. This sample will be looked at for cervical cancer or other problems. After the test:  · You may have some cramping.  · Lie down for a few minutes if you feel lightheaded.  ·  You may have some bleeding which should stop in a few days.  HOME CARE  · Do not have sex or use tampons for 2 to 3 days or as told.  · Only take medicine as told by your doctor.  · Continue to take your birth control pills as usual.  Finding out the results of your test  Ask when your test results will be ready. Make sure you get your test results.  GET HELP RIGHT AWAY IF:  · You are bleeding a lot or are passing blood clots.  · You develop a fever of 102° F (38.9° C) or higher.  · You have abnormal vaginal discharge.  · You have cramps that do not go away with medicine.  · You feel lightheaded, dizzy, or pass out (faint).  MAKE SURE YOU:   · Understand these instructions.  · Will watch your condition.  · Will get help right away if you are not doing well or get worse.  Document Released: 02/23/2008 Document Revised: 11/29/2011 Document Reviewed: 02/23/2008  ExitCare® Patient Information ©2014 [employer].

## 2013-07-05 NOTE — Progress Notes (Signed)
GYNECOLOGY CLINIC PROCEDURE NOTE  27 y.o. [id number] here for colposcopy for low-grade squamous intraepithelial neoplasia (LGSIL - encompassing HPV,mild dysplasia,CIN I) pap smear on 05/09/13. Discussed role for HPV in cervical dysplasia, need for surveillance.  Patient given informed consent, signed copy in the chart, time out was performed.  Placed in lithotomy position. Cervix viewed with speculum and colposcope after application of acetic acid.   Colposcopy adequate? Yes  no mosaicism, no punctation, no abnormal vasculature and acetowhite lesion(s) noted at 6 and 12 o'clock; biopsies obtained.  ECC specimen obtained. All specimens were labelled and sent to pathology.  Patient was given post procedure instructions.  Will follow up pathology and manage accordingly.  Routine preventative health maintenance measures emphasized.

## 2013-07-11 ENCOUNTER — Encounter: Payer: Self-pay | Admitting: *Deleted

## 2013-08-03 ENCOUNTER — Encounter: Payer: Medicaid Other | Admitting: Family Medicine

## 2013-09-07 ENCOUNTER — Encounter: Payer: Self-pay | Admitting: Family Medicine

## 2013-09-07 ENCOUNTER — Ambulatory Visit (INDEPENDENT_AMBULATORY_CARE_PROVIDER_SITE_OTHER): Payer: Medicaid Other | Admitting: Family Medicine

## 2013-09-07 VITALS — BP 126/81 | HR 116 | Temp 97.6°F | Ht 62.0 in | Wt 161.9 lb

## 2013-09-07 DIAGNOSIS — N870 Mild cervical dysplasia: Secondary | ICD-10-CM

## 2013-09-07 DIAGNOSIS — N644 Mastodynia: Secondary | ICD-10-CM

## 2013-09-07 DIAGNOSIS — N871 Moderate cervical dysplasia: Secondary | ICD-10-CM

## 2013-09-07 LAB — POCT PREGNANCY, URINE: Preg Test, Ur: NEGATIVE

## 2013-09-07 MED ORDER — NORGESTIM-ETH ESTRAD TRIPHASIC 0.18/0.215/0.25 MG-25 MCG PO TABS: 1.0000 | ORAL_TABLET | Freq: Every day | ORAL | Status: DC

## 2013-09-07 NOTE — Patient Instructions (Addendum)
[facility] [phone]  Cervical cryotherapy is a procedure which involves freezing an area of abnormal tissue on the cervix. This tissue gradually disappears and the cervix heals. One cervical cryotherapy is usually sufficient to destroy the abnormal tissue.  Purpose  Cervical cryotherapy is a standard method used to treat cervical dysplasia, meaning the removal of abnormal cell tissue on the cervix.  Description  Cervical cryotherapy, or freezing, usually lasts about five minutes and causes a slight amount of discomfort. The procedure is usually performed in an outpatient setting.  Cervical cryotherapy is done by placing a small freeze-probe (cryoprobe) against the cervix that cools the cervix to sub-zero temperatures. The cells destroyed by freezing are shed afterwards in a heavy watery discharge. The main advantage of cryotherapy is that it is a simple procedure that requires inexpensive equipment.  The cryogenic device consists of a gas tank containing a refrigerant and non-explosive, non-toxic gas (usually nitrous oxide). The gas is delivered using flexible tubing through a gun-type attachment to the cryoprobe.  Diagnosis/Preparation  Women who undergo cervical cryotherapy typically have had an abnormal Pap smear which has led to a diagnosis of cervical squamous dysplasia and usually confirmed by biopsy after an adequate colposcopic exam.  Preparation for cervical cryotherapy involves scheduling the procedure when the patient is not experiencing heavy menstrual flow. Ibuprofen or naproxen sodium may be given before cryotherapy to decrease cramping. If there is any doubt about the pregnancy status, a pregnancy test is performed.  Aftercare  Cervical cryotherapy is often followed by a heavy and often odorous discharge during the first month after the procedure. The discharge is due to the dead tissue cells leaving the treatment site. The patient should abstain from sexual  intercourse and not use tampons for a period of two weeks after the procedure. Excessive exercise should also be avoided to lessen the occurrence of post-therapy bleeding.  Risks  The following risks have been associated with cervical cryotherapy:  Uterine cramping. Often occurs during the cryotherapy but rapidly subsides after treatment.  Bleeding and infection. Rare, but incidences have been reported.  More difficult Pap smears. Future Pap smears and colposcopy may be more difficult after cryotherapy.  Normal results  A normal result is no recurrence of the abnormal cervix cells. The first follow-up Pap smear is done at 12 months then 24 months after the procedure. If normal, patients resume usual pap smear screening.  If any, recurrences usually occur within two years of treatment.  If a follow-up Pap smear is abnormal, a colposcopy with biopsy is usually performed. Other treatment methods, usually the loop electrocautery excision procedure (LEEP) are then used if persistent disease is discovered.  Following the procedure, it is considered normal to experience the following:  slight cramping for two to three days  watery discharge requiring several pad changes daily  Bloody or brown discharge, especially 12-16 days after the procedure  Alternatives  Loop electrocautery excision procedure (LEEP). This procedure uses a fine wire loop with an electric current flowing through it to remove the desired area of the cervix. Loop excision is usually done under local anesthesia and causes very little discomfort.

## 2013-09-07 NOTE — Progress Notes (Signed)
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  Here today for cryotherapy.   Also, having 5 months of breast tenderness and wondering what can be done. No fevers, discharge, erythema.   Cryotherapy details The indications for cryotherapy were reviewed with the patient in detail. She was counseled about that efficacy of this procedure, and possible need for excisional procedure in the future if her cervical dysplasia persists.  The risks of the procedure where explained in detail and patient was told to expect a copious amount of discharge in the next few weeks. All her questions were answered, and written informed consent was obtained.  The patient was placed in the dorsal lithotomy position and a vaginal speculum was placed. Her cervix was visualized and patient was noted to have had normal size transformation zone. The appropriate cryotherapy probe was picked and affixed to cryotherapy apparatus. Then nitrogen gas was then activated, the probe was coated with lubricating jelly and applied to the transformation zone of the cervix. This was kept in place for 3 minutes. The cryotherapy was then stopped and all instruments were removed from the patient's pelvis; a thawing period of 3 minutes was observed.  A second cycle of cryotherapy was then administered to the cervix for 3 minutes.  The patient tolerated the procedure well without any complications. Routine post procedure instructions were given to the patient.  Will follow up results and manage accordingly to ASCCP guidelines which specify co-testing with pap and HPV typing at 12 and 24 months.   A/p Breast tenderness - trial x 3 months of OCPs to regulate hormone levels - suspect too high of a progesterone level post her miscarriage.    F/u in 1 year for cotesting.   Shevawn Langenberg, Redmond Baseman, MD

## 2013-09-17 ENCOUNTER — Encounter: Payer: Self-pay | Admitting: *Deleted

## 2013-10-01 ENCOUNTER — Telehealth: Payer: Self-pay | Admitting: *Deleted

## 2013-10-01 NOTE — Telephone Encounter (Signed)
Patient had cryo in December she was concerned that she is still having a lot of discharge. I advised that this is considered an expected side effect. Patient also reports that she had bad period cramps this past cycle. I told her that this was probably not related. She has a followup scheduled here in a few weeks, I recommended that she keep this appt and if she had worsening symptoms or developed fever with pelvic pain to go to MAU. Patient agrees with plan.

## 2013-10-01 NOTE — Telephone Encounter (Signed)
Patient left message that she had a procedure in December and thinks she is having complications now.

## 2013-10-11 ENCOUNTER — Telehealth: Payer: Self-pay | Admitting: General Practice

## 2013-10-11 NOTE — Telephone Encounter (Signed)
Patient called and left message that she recently started taking birth control and has a question for a nurse, would like call back. Called patient and she stated that she recently started taking the birth control pills 10 days ago and now she is spotting and having cramps. Asked patient when her most recent LMP was and she said the Monday before she started the pills on Sunday. Told patient that since she just started taking the pills it can cause her to have irregular bleeding/spotting for the first few months until her body gets used to the hormones and thus cramping can happen. Suggested patient try taking ibuprofen/motrin 600mg  every 6-8 hours as needed for the cramping, but that she should not experience severe pain or heavy bleeding. Patient verbalized understanding and had no further questions

## 2013-10-15 ENCOUNTER — Ambulatory Visit (INDEPENDENT_AMBULATORY_CARE_PROVIDER_SITE_OTHER): Payer: Medicaid Other | Admitting: Family Medicine

## 2013-10-15 ENCOUNTER — Encounter: Payer: Self-pay | Admitting: Family Medicine

## 2013-10-15 VITALS — BP 117/80 | HR 81 | Temp 97.5°F | Ht 62.0 in | Wt 162.2 lb

## 2013-10-15 DIAGNOSIS — N921 Excessive and frequent menstruation with irregular cycle: Secondary | ICD-10-CM

## 2013-10-15 DIAGNOSIS — N898 Other specified noninflammatory disorders of vagina: Secondary | ICD-10-CM

## 2013-10-15 NOTE — Progress Notes (Signed)
S:  28 yo [id number] here for several concerns.   1) hx of cryotherapy - done 12/19 - had sex two days later and noticed that her discharge seemed to worsen - now discharge seems to be improving but still has some occasionally - slight spotting post intercourse - wondering if this is normal - mostly wanting reassurance that having sex 2 days after cryo didn't hurt something.  No fevers, bloating, heaving bleeding, diarrhea, constipation, dysuria.   2) period - was started on OCPs at last visit for breast tenderness and concern given waniting time before next pregnancy - says that is now into 3rd week of first pill pack. Started spotting this last week - breast tenderness improved but wondering what to do about spotting - still not to placebo pills Bleeding currently  No HA, nausea, fevers.   O: Filed Vitals:   10/15/13 1510  BP: 117/80  Pulse: 81  Temp: 97.5 F (36.4 C)  Height: 5\' 2"  (1.575 m)  Weight: 73.573 kg (162 lb 3.2 oz)   GEN: NAD, well appearing LUNGS: normal effort CV: RRR ABD: soft NT, ND.  GU: deferred  A/P  28 yo here for above.   1) discharge since cryotherapy - reassurance - vaginal bleeding also normal post intercourse as has just been 5 weeks - should this continue to next month, recommend she return for exam - currently menstruating so difficult to ascertain degree of symptoms.   2) breakthrough bleeding - midcycle bleeding - could be due to low estrogen component but suspect more related to new OCP start - recommend one more month of trial and if still having breakthrough bleeding, will plan to increase estrogen component.   F/u in 1 month if needed.   Rileyann Florance, Redmond BasemanKELI L, MD

## 2013-10-15 NOTE — Progress Notes (Signed)
States here because continues to have problems since crytotheraphy- having brownish or yellowish discharge.  Also period came early.

## 2013-11-08 ENCOUNTER — Telehealth: Payer: Self-pay | Admitting: General Practice

## 2013-11-08 NOTE — Telephone Encounter (Signed)
Pt called and stated that she is having problems with her BC.

## 2013-11-14 NOTE — Telephone Encounter (Signed)
Called pt and discussed her concern. She stated that she has had breakthrough bleeding for the past 2 months but it has only lasted for 1 day. I advised pt that this is wnl for now as her body is probably just getting [facility]. If this continues, she should let us know as it may be necessary to change her prescription to another pill.  Pt voiced understanding.

## 2014-01-03 ENCOUNTER — Telehealth: Payer: Self-pay | Admitting: *Deleted

## 2014-01-03 NOTE — Telephone Encounter (Addendum)
Pt left message stating that she was @ Urgent Care and there is a concern that she may be allergic to her birth control pills. Please call back. 4/17  0950  Called pt and discussed her concern. She states that she has noticed some bumps on the back of her tongue and went to [facility] [address] for [address]. The problem started 1 week ago. She was given a 6 day course of steroids. She was also told to contact us to discuss the possibility of allergic reaction to her birth control pills. She has been taking the OCP's since December. Pt denies using any new oral products (toothpaste, mouthwash) or eating any new foods. She denies having oral sex. She reports seasonal allergies for which she takes Claritin.  I advised her that an allergy to OCP's is highly unlikely because they are made of hormones which occur naturally in females. Also she has been taking them for over 3 months and an allergic reaction would normally occur rather quickly. I advised her to get a new toothbrush, continue taking Claritin and steroids as prescribed and to call us back if the problem persists. Pt voiced understanding.

## 2014-05-23 IMAGING — US US OB TRANSVAGINAL
1 series · 14 of 28 positions shown · non-contrast
Comparison: 03/24/2013

CLINICAL DATA: Severe pain after spontaneous abortion

TRANSVAGINAL OB ULTRASOUND
TECHNIQUE: Transvaginal ultrasound was performed for evaluation of
the gestation as well as the maternal uterus and adnexal regions.

[Series 1: us ob transvaginal · 29 acquisitions, 14 frames shown]
[im 2/29]
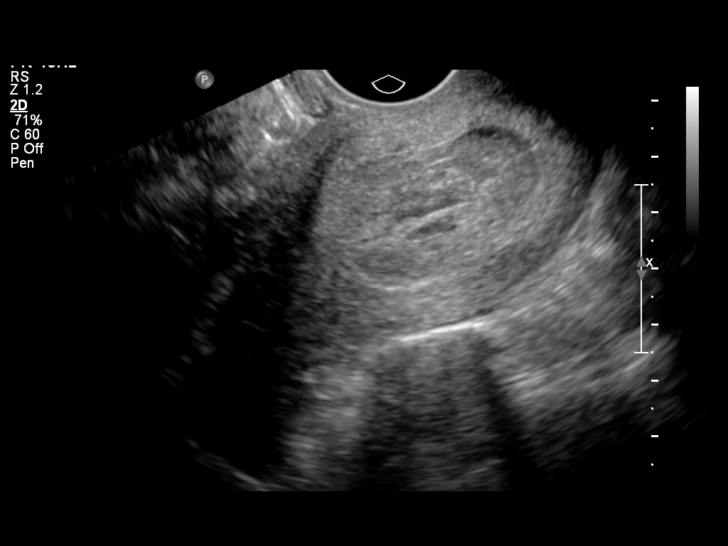
[im 4/29]
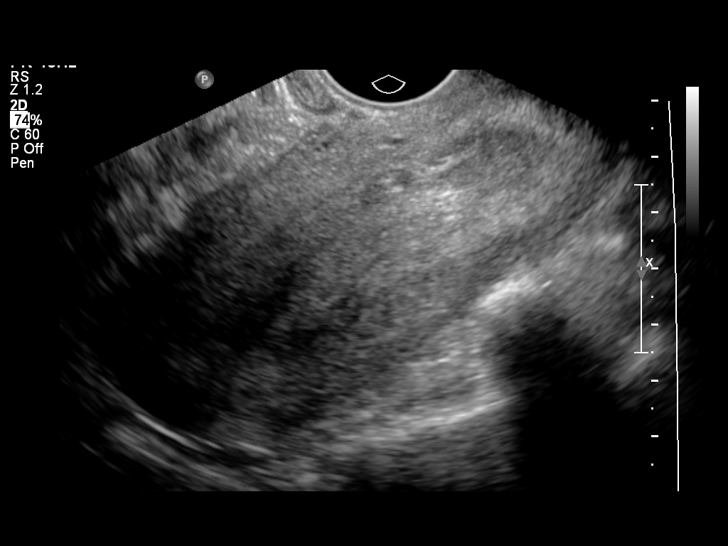
[im 6/29]
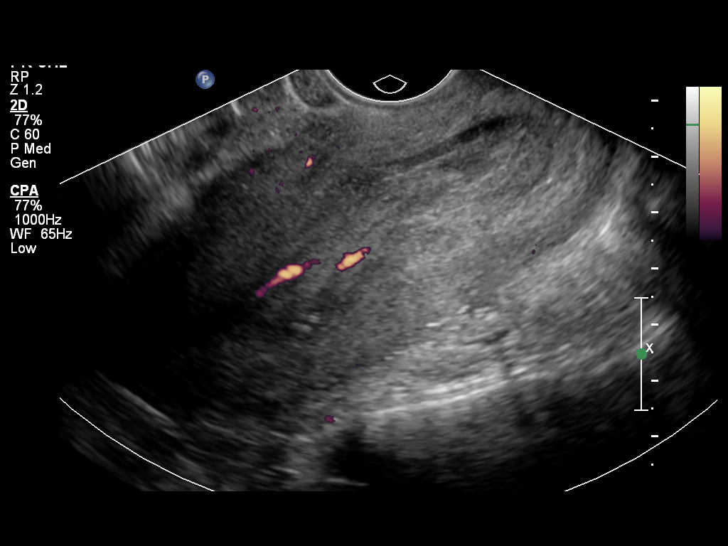
[im 8/29]
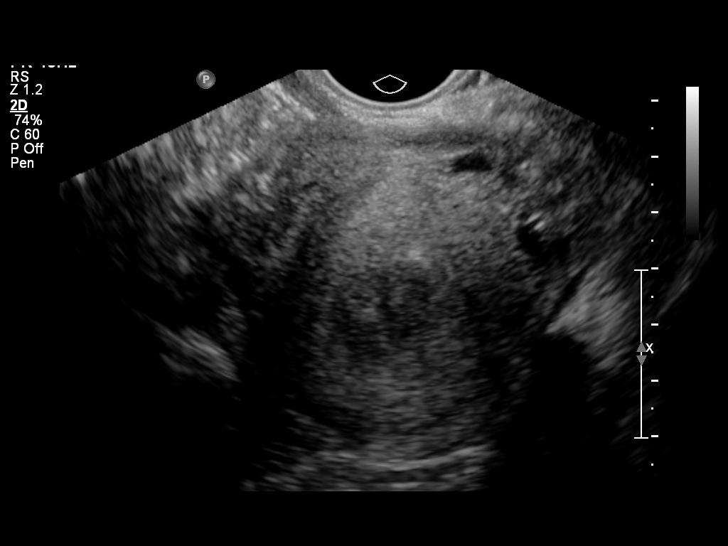
[im 10/29]
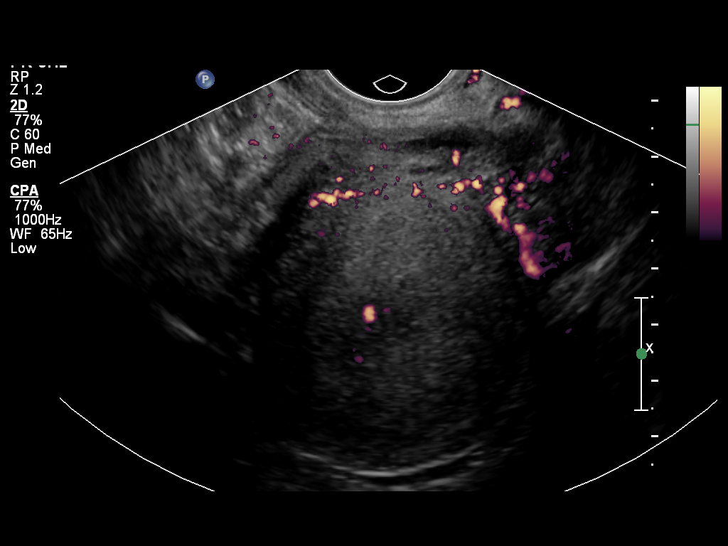
[im 12/29]
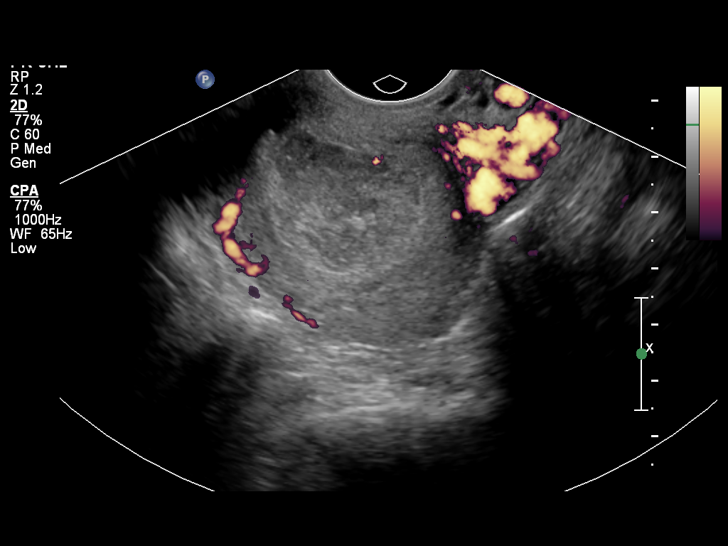
[im 14/29]
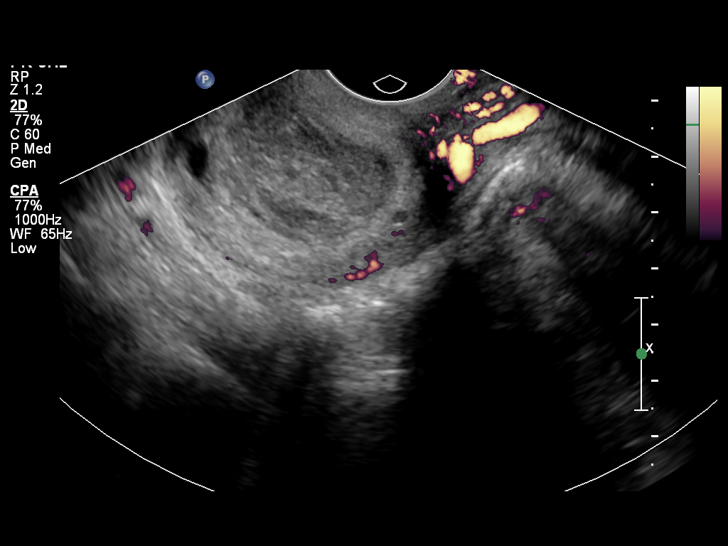
[im 16/29]
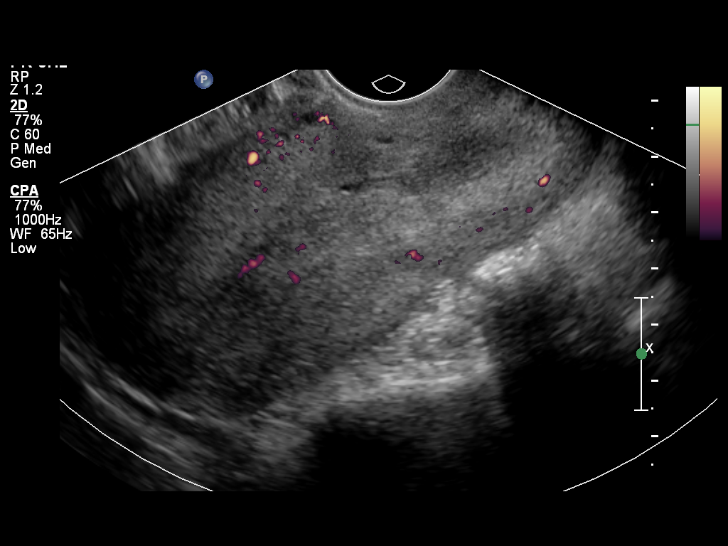
[im 18/29]
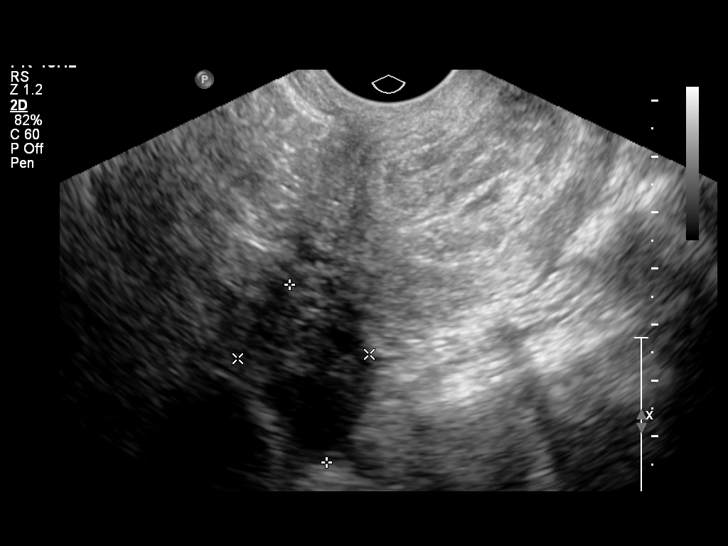
[im 20/29]
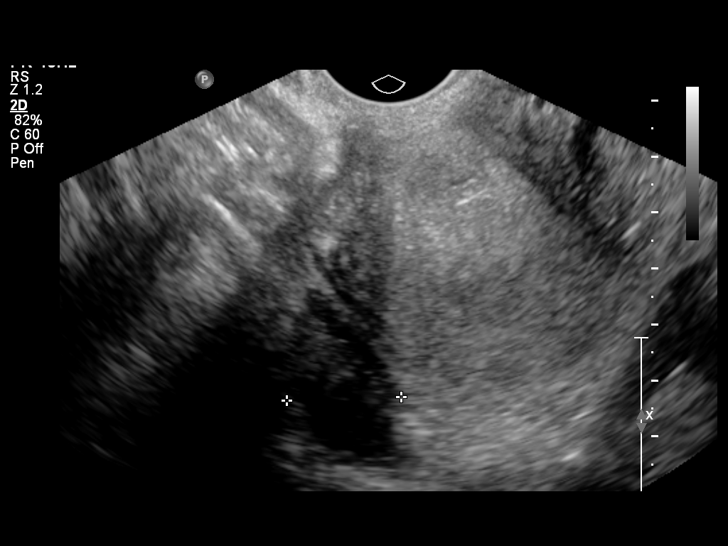
[im 22/29]
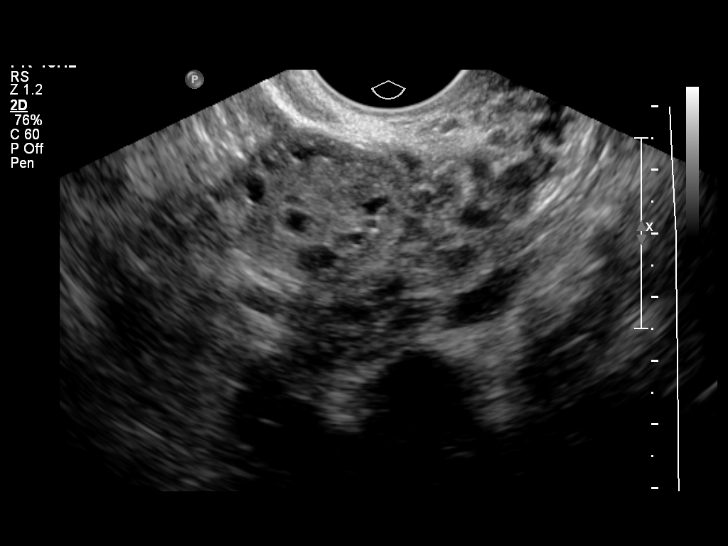
[im 24/29]
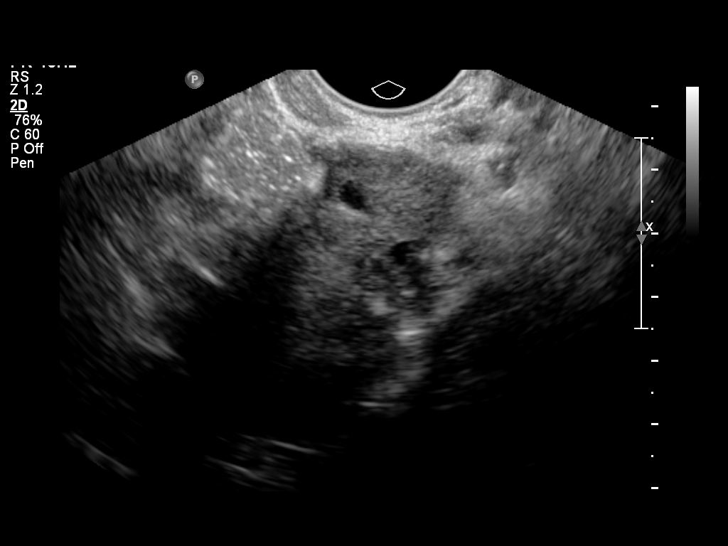
[im 26/29]
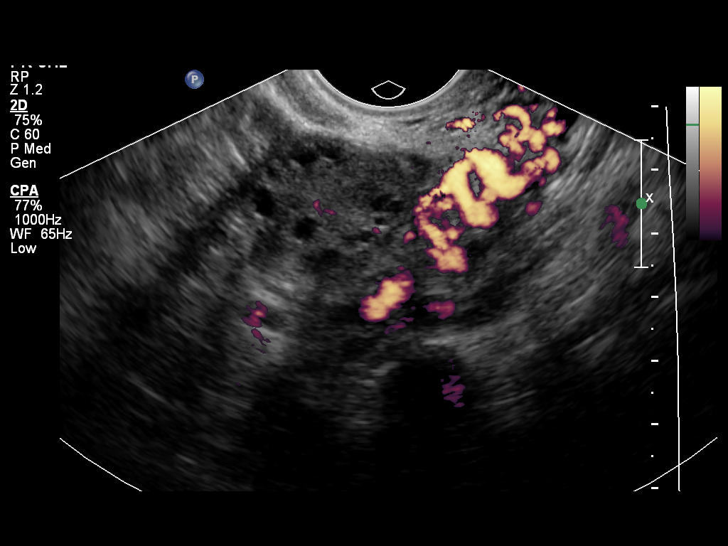
[im 29/29]
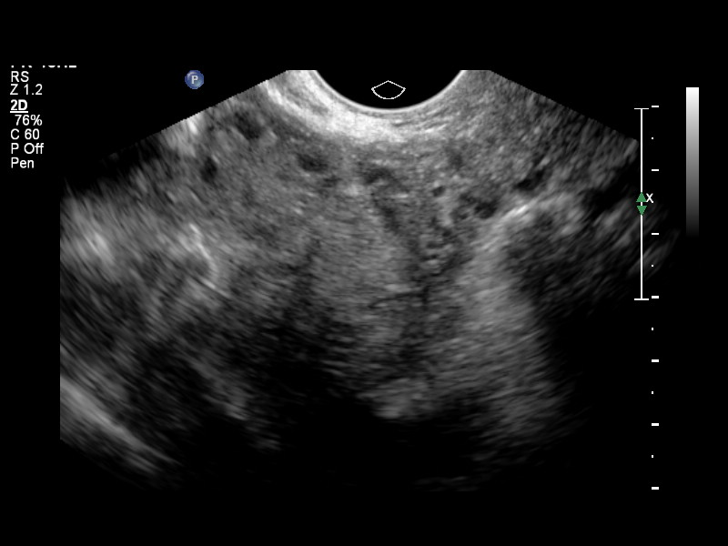

[14 of 28 positions shown; findings below may reference images not displayed]

FINDINGS: No intrauterine gestational sac is currently visualized.
Endometrium is thickened and heterogeneous.  Blood flow is noted
within the thickened endometrium within the fundus suggesting
retained products of conception.  Blood clot noted within the
endometrium in the lower uterine segment/cervix region.

Ovaries are symmetric in size and echotexture.  No adnexal masses.
No free fluid.
IMPRESSION: No intrauterine gestational sac currently visualized.  Thickened,
heterogeneous endometrium.  Blood flows seen within the endometrium
in the fundus suggesting retained products conception.

## 2014-07-05 ENCOUNTER — Encounter (HOSPITAL_COMMUNITY): Payer: Self-pay | Admitting: *Deleted

## 2014-07-05 ENCOUNTER — Inpatient Hospital Stay (HOSPITAL_COMMUNITY)
Admission: AD | Admit: 2014-07-05 | Discharge: 2014-07-05 | Disposition: A | Discharge status: Home / Self Care | Payer: Medicaid Other | Source: Ambulatory Visit | Attending: Obstetrics & Gynecology | Admitting: Obstetrics & Gynecology

## 2014-07-05 DIAGNOSIS — O99330 Smoking (tobacco) complicating pregnancy, unspecified trimester: Secondary | ICD-10-CM | POA: Insufficient documentation

## 2014-07-05 DIAGNOSIS — F17210 Nicotine dependence, cigarettes, uncomplicated: Secondary | ICD-10-CM | POA: Diagnosis not present

## 2014-07-05 DIAGNOSIS — Z3A00 Weeks of gestation of pregnancy not specified: Secondary | ICD-10-CM | POA: Diagnosis not present

## 2014-07-05 DIAGNOSIS — R109 Unspecified abdominal pain: Secondary | ICD-10-CM | POA: Diagnosis present

## 2014-07-05 DIAGNOSIS — N939 Abnormal uterine and vaginal bleeding, unspecified: Secondary | ICD-10-CM

## 2014-07-05 DIAGNOSIS — O209 Hemorrhage in early pregnancy, unspecified: Secondary | ICD-10-CM | POA: Diagnosis not present

## 2014-07-05 HISTORY — DX: Headache, unspecified: R51.9

## 2014-07-05 HISTORY — DX: Unspecified abnormal cytological findings in specimens from vagina: R87.629

## 2014-07-05 HISTORY — DX: Anxiety disorder, unspecified: F41.9

## 2014-07-05 HISTORY — DX: Depression, unspecified: F32.A

## 2014-07-05 HISTORY — DX: Headache: R51

## 2014-07-05 HISTORY — DX: Unspecified infectious disease: B99.9

## 2014-07-05 HISTORY — DX: Major depressive disorder, single episode, unspecified: F32.9

## 2014-07-05 LAB — URINALYSIS, ROUTINE W REFLEX MICROSCOPIC
Bilirubin Urine: NEGATIVE
Glucose, UA: NEGATIVE mg/dL
KETONES UR: NEGATIVE mg/dL
Leukocytes, UA: NEGATIVE
Nitrite: NEGATIVE
PROTEIN: NEGATIVE mg/dL
Specific Gravity, Urine: <1.005 — ABNORMAL LOW (ref 1.005–1.030)
Urobilinogen, UA: 0.2 mg/dL (ref 0.0–1.0)
pH: 5.5 (ref 5.0–8.0)

## 2014-07-05 LAB — WET PREP, GENITAL
Clue Cells Wet Prep HPF POC: NONE SEEN
Trich, Wet Prep: NONE SEEN
Yeast Wet Prep HPF POC: NONE SEEN

## 2014-07-05 LAB — HIV ANTIBODY (ROUTINE TESTING W REFLEX): HIV 1&2 Ab, 4th Generation: NONREACTIVE

## 2014-07-05 LAB — URINE MICROSCOPIC-ADD ON

## 2014-07-05 LAB — POCT PREGNANCY, URINE: PREG TEST UR: NEGATIVE

## 2014-07-05 LAB — HCG, QUANTITATIVE, PREGNANCY: hCG, Beta Chain, Quant, S: 3 m[IU]/mL (ref <5)

## 2014-07-05 NOTE — MAU Note (Signed)
Patient states she has had positive home pregnancy tests. Started having abdominal cramping and spotting today. Some nausea, no vomiting this am.

## 2014-07-05 NOTE — MAU Note (Signed)
Pt reports 2 +HPT, test here was neg.

## 2014-07-05 NOTE — MAU Note (Signed)
Bleeding noted when used restroom this morning.  Seems to be getting heavier.  Cramping in lower abd and back started about the same time.

## 2014-07-05 NOTE — MAU Provider Note (Signed)
History     CSN: [id number]  Arrival date and time: 07/05/14 [id number]   None     Chief Complaint  Patient presents with  . Possible Pregnancy  . Abdominal Cramping  . Vaginal Bleeding   HPI Selena BatemanJessica Wang 28 y.o. [id number] presents to MAU with bleeding and cramping.  She took 2 home pregnancy tests last week that were both positive.  Today she began bleeding and having cramping.  LMP was 06/01/14 and was a regular period.  She has recently had vaginal discharge.  She noted pain in the vagina the same day she took the HPT.  She denies nausea, vomiting, fever, dysuria.  She is not on contraceptive.   OB History   Grav Para Term Preterm Abortions TAB SAB Ect Mult Living   3 1 1  0 2 1 1  0 0 1      Past Medical History  Diagnosis Date  . Medical history non-contributory   . Abnormal Pap smear   . Headache   . Infection     UTI  . Vaginal Pap smear, abnormal     colpo  . Anxiety   . Depression     Past Surgical History  Procedure Laterality Date  . No past surgeries    . Wisdom tooth extraction      age 28  . Induced abortion      Family History  Problem Relation Age of Onset  . Depression Mother   . Thyroid disease Mother   . Anxiety disorder Mother   . Hypertension Father   . Depression Father   . Anxiety disorder Father   . Depression Sister   . Anxiety disorder Sister   . Hearing loss Maternal Grandmother     age related  . Cancer Paternal Grandmother     History  Substance Use Topics  . Smoking status: Current Every Day Smoker -- 1.00 packs/day for 12 years    Types: Cigarettes  . Smokeless tobacco: Never Used  . Alcohol Use: No     Comment: occassion    Allergies: No Known Allergies  Prescriptions prior to admission  Medication Sig Dispense Refill  . acetaminophen (TYLENOL) 325 MG tablet Take 650 mg by mouth every 6 (six) hours as needed for headache.      . ibuprofen (ADVIL,MOTRIN) 200 MG tablet Take 400-600 mg by mouth every 6 (six) hours as  needed for headache (depends on pain if takes 2 or 3 tablets).      . loratadine (CLARITIN) 10 MG tablet Take 10 mg by mouth daily.        ROS Pertinent ROS in HPI Physical Exam   Blood pressure 125/84, pulse 103, temperature 99.1 F (37.3 C), temperature source Oral, resp. rate 16, height 5\' 3"  (1.6 m), weight 166 lb 12.8 oz (75.66 kg), last menstrual period 06/01/2014, SpO2 99.00%.  Physical Exam  Constitutional: She is oriented to person, place, and time. She appears well-developed and well-nourished.  HENT:  Head: Normocephalic and atraumatic.  Eyes: EOM are normal.  Neck: Normal range of motion.  Cardiovascular: Normal rate, regular rhythm and normal heart sounds.   Respiratory: Effort normal and breath sounds normal. No respiratory distress.  GI: Soft. Bowel sounds are normal. She exhibits no distension. There is no tenderness. There is no rebound.  Genitourinary:  NO fluid/discharge/bleeding noted externally.   Mod amt of dark red blood pooled in vagina but no additional blood observed to be coming from os.  No discrete discharge seen.  Cervix is closed with no CMP/adnexal mass or tenderness  Musculoskeletal: Normal range of motion.  Neurological: She is alert and oriented to person, place, and time.  Skin: Skin is warm and dry.  Psychiatric: She has a normal mood and affect.   Results for orders placed during the hospital encounter of 07/05/14 (from the past 24 hour(s))  URINALYSIS, ROUTINE W REFLEX MICROSCOPIC     Status: Abnormal   Collection Time    07/05/14  9:40 AM      Result Value Ref Range   Color, Urine YELLOW  YELLOW   APPearance CLEAR  CLEAR   Specific Gravity, Urine <1.005 (*) 1.005 - 1.030   pH 5.5  5.0 - 8.0   Glucose, UA NEGATIVE  NEGATIVE mg/dL   Hgb urine dipstick LARGE (*) NEGATIVE   Bilirubin Urine NEGATIVE  NEGATIVE   Ketones, ur NEGATIVE  NEGATIVE mg/dL   Protein, ur NEGATIVE  NEGATIVE mg/dL   Urobilinogen, UA 0.2  0.0 - 1.0 mg/dL   Nitrite  NEGATIVE  NEGATIVE   Leukocytes, UA NEGATIVE  NEGATIVE  URINE MICROSCOPIC-ADD ON     Status: None   Collection Time    07/05/14  9:40 AM      Result Value Ref Range   Squamous Epithelial / LPF RARE  RARE   RBC / HPF 0-2  <3 RBC/hpf  WET PREP, GENITAL     Status: Abnormal   Collection Time    07/05/14 10:45 AM      Result Value Ref Range   Yeast Wet Prep HPF POC NONE SEEN  NONE SEEN   Trich, Wet Prep NONE SEEN  NONE SEEN   Clue Cells Wet Prep HPF POC NONE SEEN  NONE SEEN   WBC, Wet Prep HPF POC FEW (*) NONE SEEN  HCG, QUANTITATIVE, PREGNANCY     Status: None   Collection Time    07/05/14 11:00 AM      Result Value Ref Range   hCG, Beta Chain, Quant, S 3  <5 mIU/mL     MAU Course  Procedures  MDM Quant HCG of 3.  ?Early failed pregnancy (with history of 2 positive HPT).  No signs of infection identified.    Assessment and Plan  A: Vaginal bleeding  P: Discharge to home OTC Ibuprofen for cramping. Return to MAU for bleeding >1 pad/hour or severe, uncontrolled pain/other GYN emergency. Advise PNV daily.    Selena Wang, Selena Wang E 07/05/2014, 10:31 AM

## 2014-07-05 NOTE — Discharge Instructions (Signed)
Abnormal Uterine Bleeding Abnormal uterine bleeding can affect women at various stages in life, including teenagers, women in their reproductive years, pregnant women, and women who have reached menopause. Several kinds of uterine bleeding are considered abnormal, including:  Bleeding or spotting between periods.   Bleeding after sexual intercourse.   Bleeding that is heavier or more than normal.   Periods that last longer than usual.  Bleeding after menopause.  Many cases of abnormal uterine bleeding are minor and simple to treat, while others are more serious. Any type of abnormal bleeding should be evaluated by your health care provider. Treatment will depend on the cause of the bleeding. HOME CARE INSTRUCTIONS Monitor your condition for any changes. The following actions may help to alleviate any discomfort you are experiencing:  Avoid the use of tampons and douches as directed by your health care provider.  Change your pads frequently. You should get regular pelvic exams and Pap tests. Keep all follow-up appointments for diagnostic tests as directed by your health care provider.  SEEK MEDICAL CARE IF:   Your bleeding lasts more than 1 week.   You feel dizzy at times.  SEEK IMMEDIATE MEDICAL CARE IF:   You pass out.   You are changing pads every 15 to 30 minutes.   You have abdominal pain.  You have a fever.   You become sweaty or weak.   You are passing large blood clots from the vagina.   You start to feel nauseous and vomit. MAKE SURE YOU:   Understand these instructions.  Will watch your condition.  Will get help right away if you are not doing well or get worse. Document Released: 09/06/2005 Document Revised: 09/11/2013 Document Reviewed: 04/05/2013 Exit[employer] Patient Information 2015 [employer]. This information is not intended to replace advice given to you by your health care provider. Make sure you discuss any questions you have with your  health care provider.  

## 2014-07-06 LAB — GC/CHLAMYDIA PROBE AMP
CT PROBE, AMP APTIMA: NEGATIVE
GC PROBE AMP APTIMA: NEGATIVE

## 2014-07-06 NOTE — MAU Provider Note (Signed)

## 2014-07-22 ENCOUNTER — Encounter (HOSPITAL_COMMUNITY): Payer: Self-pay | Admitting: *Deleted

## 2014-07-25 ENCOUNTER — Ambulatory Visit: Payer: Medicaid Other | Admitting: Obstetrics & Gynecology

## 2014-11-04 ENCOUNTER — Encounter (HOSPITAL_COMMUNITY): Payer: Self-pay | Admitting: *Deleted

## 2014-11-04 ENCOUNTER — Inpatient Hospital Stay (HOSPITAL_COMMUNITY): Payer: Medicaid Other

## 2014-11-04 ENCOUNTER — Inpatient Hospital Stay (HOSPITAL_COMMUNITY)
Admission: AD | Admit: 2014-11-04 | Discharge: 2014-11-04 | Disposition: A | Discharge status: Home / Self Care | Payer: Medicaid Other | Source: Ambulatory Visit | Attending: Obstetrics & Gynecology | Admitting: Obstetrics & Gynecology

## 2014-11-04 DIAGNOSIS — Z3A01 Less than 8 weeks gestation of pregnancy: Secondary | ICD-10-CM

## 2014-11-04 DIAGNOSIS — R109 Unspecified abdominal pain: Secondary | ICD-10-CM | POA: Insufficient documentation

## 2014-11-04 DIAGNOSIS — F17210 Nicotine dependence, cigarettes, uncomplicated: Secondary | ICD-10-CM | POA: Diagnosis not present

## 2014-11-04 DIAGNOSIS — O3481 Maternal care for other abnormalities of pelvic organs, first trimester: Secondary | ICD-10-CM | POA: Diagnosis not present

## 2014-11-04 DIAGNOSIS — B373 Candidiasis of vulva and vagina: Secondary | ICD-10-CM

## 2014-11-04 DIAGNOSIS — N8320 Unspecified ovarian cysts: Secondary | ICD-10-CM | POA: Insufficient documentation

## 2014-11-04 DIAGNOSIS — N83201 Unspecified ovarian cyst, right side: Secondary | ICD-10-CM

## 2014-11-04 DIAGNOSIS — O26899 Other specified pregnancy related conditions, unspecified trimester: Secondary | ICD-10-CM

## 2014-11-04 DIAGNOSIS — O98811 Other maternal infectious and parasitic diseases complicating pregnancy, first trimester: Secondary | ICD-10-CM

## 2014-11-04 DIAGNOSIS — Z3A08 8 weeks gestation of pregnancy: Secondary | ICD-10-CM | POA: Diagnosis not present

## 2014-11-04 DIAGNOSIS — O9989 Other specified diseases and conditions complicating pregnancy, childbirth and the puerperium: Secondary | ICD-10-CM

## 2014-11-04 DIAGNOSIS — O99331 Smoking (tobacco) complicating pregnancy, first trimester: Secondary | ICD-10-CM | POA: Insufficient documentation

## 2014-11-04 DIAGNOSIS — B3731 Acute candidiasis of vulva and vagina: Secondary | ICD-10-CM

## 2014-11-04 LAB — WET PREP, GENITAL
CLUE CELLS WET PREP: NONE SEEN
Trich, Wet Prep: NONE SEEN

## 2014-11-04 LAB — URINALYSIS, ROUTINE W REFLEX MICROSCOPIC
Bilirubin Urine: NEGATIVE
Glucose, UA: NEGATIVE mg/dL
HGB URINE DIPSTICK: NEGATIVE
KETONES UR: NEGATIVE mg/dL
LEUKOCYTES UA: NEGATIVE
NITRITE: NEGATIVE
Protein, ur: NEGATIVE mg/dL
SPECIFIC GRAVITY, URINE: 1.01 (ref 1.005–1.030)
UROBILINOGEN UA: 0.2 mg/dL (ref 0.0–1.0)
pH: 6 (ref 5.0–8.0)

## 2014-11-04 LAB — CBC
HCT: 41.4 % (ref 36.0–46.0)
Hemoglobin: 14.5 g/dL (ref 12.0–15.0)
MCH: 32.1 pg (ref 26.0–34.0)
MCHC: 35 g/dL (ref 30.0–36.0)
MCV: 91.6 fL (ref 78.0–100.0)
Platelets: 184 10*3/uL (ref 150–400)
RBC: 4.52 MIL/uL (ref 3.87–5.11)
RDW: 12.3 % (ref 11.5–15.5)
WBC: 9.3 10*3/uL (ref 4.0–10.5)

## 2014-11-04 LAB — HCG, QUANTITATIVE, PREGNANCY: hCG, Beta Chain, Quant, S: 40945 m[IU]/mL — ABNORMAL HIGH (ref <5)

## 2014-11-04 LAB — POCT PREGNANCY, URINE: Preg Test, Ur: POSITIVE — AB

## 2014-11-04 MED ORDER — ACETAMINOPHEN-CODEINE #3 300-30 MG PO TABS: 1.0000 | ORAL_TABLET | Freq: Four times a day (QID) | ORAL | Status: DC | PRN

## 2014-11-04 MED ORDER — FLUCONAZOLE 150 MG PO TABS: 150.0000 mg | ORAL_TABLET | Freq: Every day | ORAL | Status: DC

## 2014-11-04 NOTE — MAU Provider Note (Signed)
History     CSN: [id number]  Arrival date and time: 11/04/14 1236   None     Chief Complaint  Patient presents with  . Abdominal Pain   HPI   Ms.Selena Wang is 29 y.o. female [id number] at [redacted]w[redacted]d who presents with abdominal pain. The pain started 2 weeks ago; the pain comes and goes. The pain is located in the lower part of her stomach on both sides.  She was seen at an urgent care a few days ago and was told she had a yeast infection. She did not fill the RX.   OB History    Gravida Para Term Preterm AB TAB SAB Ectopic Multiple Living   0 0 0 1      Past Medical History  Diagnosis Date  . Abnormal Pap smear   . Headache   . Infection     UTI  . Vaginal Pap smear, abnormal     colpo  . Anxiety   . Depression     Past Surgical History  Procedure Laterality Date  . Wisdom tooth extraction      age 34  . Induced abortion      Family History  Problem Relation Age of Onset  . Depression Mother   . Thyroid disease Mother   . Anxiety disorder Mother   . Hypertension Father   . Depression Father   . Anxiety disorder Father   . Depression Sister   . Anxiety disorder Sister   . Hearing loss Maternal Grandmother     age related  . Cancer Paternal Grandmother     History  Substance Use Topics  . Smoking status: Current Every Day Smoker -- 1.00 packs/day for 12 years    Types: Cigarettes  . Smokeless tobacco: Never Used  . Alcohol Use: No     Comment: occassion    Allergies: No Known Allergies  Prescriptions prior to admission  Medication Sig Dispense Refill Last Dose  . acetaminophen (TYLENOL) 500 MG tablet Take 1,000 mg by mouth every 6 (six) hours as needed for mild pain.   11/04/2014 at Unknown time  . ARIPiprazole (ABILIFY) 5 MG tablet Take 5 mg by mouth daily.   11/03/2014 at Unknown time  . sertraline (ZOLOFT) 100 MG tablet Take 100 mg by mouth daily.   11/03/2014 at Unknown time   Results for orders placed or performed during the  hospital encounter of 11/04/14 (from the past 48 hour(s))  Urinalysis, Routine w reflex microscopic     Status: Abnormal   Collection Time: 11/04/14  1:00 PM  Result Value Ref Range   Color, Urine STRAW (A) YELLOW   APPearance CLEAR CLEAR   Specific Gravity, Urine 1.010 1.005 - 1.030   pH 6.0 5.0 - 8.0   Glucose, UA NEGATIVE NEGATIVE mg/dL   Hgb urine dipstick NEGATIVE NEGATIVE   Bilirubin Urine NEGATIVE NEGATIVE   Ketones, ur NEGATIVE NEGATIVE mg/dL   Protein, ur NEGATIVE NEGATIVE mg/dL   Urobilinogen, UA 0.2 0.0 - 1.0 mg/dL   Nitrite NEGATIVE NEGATIVE   Leukocytes, UA NEGATIVE NEGATIVE    Comment: MICROSCOPIC NOT DONE ON URINES WITH NEGATIVE PROTEIN, BLOOD, LEUKOCYTES, NITRITE, OR GLUCOSE <1000 mg/dL.  Pregnancy, urine POC     Status: Abnormal   Collection Time: 11/04/14  1:07 PM  Result Value Ref Range   Preg Test, Ur POSITIVE (A) NEGATIVE    Comment:        THE SENSITIVITY OF THIS METHODOLOGY  IS >24 mIU/mL   Wet prep, genital     Status: Abnormal   Collection Time: 11/04/14  2:16 PM  Result Value Ref Range   Yeast Wet Prep HPF POC FEW (A) NONE SEEN   Trich, Wet Prep NONE SEEN NONE SEEN   Clue Cells Wet Prep HPF POC NONE SEEN NONE SEEN   WBC, Wet Prep HPF POC FEW (A) NONE SEEN    Comment: MANY BACTERIA SEEN  CBC     Status: None   Collection Time: 11/04/14  2:20 PM  Result Value Ref Range   WBC 9.3 4.0 - 10.5 K/uL   RBC 4.52 3.87 - 5.11 MIL/uL   Hemoglobin 14.5 12.0 - 15.0 g/dL   HCT 96.041.4 45.436.0 - 09.846.0 %   MCV 91.6 78.0 - 100.0 fL   MCH 32.1 26.0 - 34.0 pg   MCHC 35.0 30.0 - 36.0 g/dL   RDW 11.912.3 14.711.5 - 82.915.5 %   Platelets 184 150 - 400 K/uL  hCG, quantitative, pregnancy     Status: Abnormal   Collection Time: 11/04/14  2:20 PM  Result Value Ref Range   hCG, Beta Chain, Quant, S 40945 (H) <5 mIU/mL    Comment:          GEST. AGE      CONC.  (mIU/mL)   <=1 WEEK        5 - 50     2 WEEKS       50 - 500     3 WEEKS       100 - 10,000     4 WEEKS     1,000 - 30,000      5 WEEKS     3,500 - 115,000   6-8 WEEKS     12,000 - 270,000    12 WEEKS     15,000 - 220,000        FEMALE AND NON-PREGNANT FEMALE:     LESS THAN 5 mIU/mL     [place] Ob Comp Less 14 Wks  11/04/2014   CLINICAL DATA:  Cramping for 2 weeks.  EXAM: OBSTETRIC <14 WK US AND TRANSVAGINAL OB US  TECHNIQUE: Both transabdominal and transvaginal ultrasound examinations were performed for complete evaluation of the gestation as well as the maternal uterus, adnexal regions, and pelvic cul-de-sac. Transvaginal technique was performed to assess early pregnancy.  COMPARISON:  None.  FINDINGS: Intrauterine gestational sac: Single  Yolk sac:  Yes  Embryo:  Yes  Cardiac Activity: Yes  Heart Rate: 108  bpm  CRL:  4.0 mm   6 w   1 d                  US EDC: 06/29/2015  Maternal uterus/adnexae:  Subchorionic hemorrhage: None  Right ovary: Normal.  Multiple cysts noted.  Left ovary: Normal  Other :Normal  Free fluid:  Trace free fluid noted.  IMPRESSION: 1. Single living intrauterine gestation. The estimated gestational age is 6 weeks and 1 day.   Electronically Signed   By: Signa Kellaylor  Stroud M.D.   On: 11/04/2014 15:39   [place] Ob Transvaginal  11/04/2014   CLINICAL DATA:  Cramping for 2 weeks.  EXAM: OBSTETRIC <14 WK US AND TRANSVAGINAL OB US  TECHNIQUE: Both transabdominal and transvaginal ultrasound examinations were performed for complete evaluation of the gestation as well as the maternal uterus, adnexal regions, and pelvic cul-de-sac. Transvaginal technique was performed to assess early pregnancy.  COMPARISON:  None.  FINDINGS: Intrauterine gestational  sac: Single  Yolk sac:  Yes  Embryo:  Yes  Cardiac Activity: Yes  Heart Rate: 108  bpm  CRL:  4.0 mm   6 w   1 d                  [place] EDC: 06/29/2015  Maternal uterus/adnexae:  Subchorionic hemorrhage: None  Right ovary: Normal.  Multiple cysts noted.  Left ovary: Normal  Other :Normal  Free fluid:  Trace free fluid noted.  IMPRESSION: 1. Single living intrauterine gestation. The  estimated gestational age is 6 weeks and 1 day.   Electronically Signed   By: Signa Kell M.D.   On: 11/04/2014 15:39      Review of Systems  Constitutional: Negative for fever and chills.  Gastrointestinal: Positive for nausea and abdominal pain. Negative for vomiting, diarrhea and constipation.  Genitourinary: Negative for dysuria, urgency and frequency.       Denies vaginal bleeding  + occasional vaginal itching.    Physical Exam   Blood pressure 120/77, pulse 85, temperature 97.7 F (36.5 C), temperature source Oral, resp. rate 16, height  (1.575 m), weight 75.921 kg (167 lb 6 oz), last menstrual period 09/03/2014, SpO2 100 %.  Physical Exam  Constitutional: She is oriented to person, place, and time. She appears well-developed and well-nourished.  Non-toxic appearance. She does not have a sickly appearance. She does not appear ill. No distress.  HENT:  Head: Normocephalic.  Neck: Neck supple.  GI: Soft. Normal appearance. She exhibits no distension. There is no tenderness. There is no rigidity, no rebound and no guarding.  Genitourinary:  Speculum exam: Vagina - Small amount of creamy discharge, no odor Cervix - No contact bleeding Bimanual exam: Cervix closed Uterus non tender, enlarged  Adnexa non tender, no masses bilaterally, +suprapubic tenderness  GC/Chlam, wet prep done Chaperone present for exam.   Musculoskeletal: Normal range of motion.  Neurological: She is alert and oriented to person, place, and time.  Skin: Skin is warm. She is not diaphoretic.  Psychiatric: Her behavior is normal.    MAU Course  Procedures  None  MDM Wet prep GC O positive blood type Quant [place]   Assessment and Plan    A: Multiple right ovarian cysts Abdominal pain in pregnancy  SIUP @ [redacted]w[redacted]d Vaginal yeast infection   P: Discharge home in stable condition RX: tylenol #3, diflucan  Start prenatal care ASAP Pregnancy verification letter given Return to MAU as  needed, if symptoms worsen   Iona Hansen Vernell Townley, NP 11/04/2014 2:09 PM

## 2014-11-04 NOTE — Discharge Instructions (Signed)

## 2014-11-04 NOTE — MAU Note (Signed)
Pos UPT @ Urgent Care 2 weeks ago, lower abd cramping intermittent also for the past 2 weeks.  Denies vaginal bleeding.

## 2014-11-04 NOTE — MAU Note (Signed)
Pt states here for cramping x2 weeks since finding out she was pregnant. Pain is intermittent. No bleeding or abnormal vag d/c.

## 2014-11-05 LAB — GC/CHLAMYDIA PROBE AMP (~~LOC~~) NOT AT ARMC
Chlamydia: NEGATIVE
NEISSERIA GONORRHEA: NEGATIVE

## 2015-02-05 ENCOUNTER — Other Ambulatory Visit (HOSPITAL_COMMUNITY): Payer: Self-pay | Admitting: Family Medicine

## 2015-02-05 DIAGNOSIS — Z3482 Encounter for supervision of other normal pregnancy, second trimester: Secondary | ICD-10-CM

## 2015-02-11 ENCOUNTER — Other Ambulatory Visit (HOSPITAL_COMMUNITY): Payer: Self-pay | Admitting: Family Medicine

## 2015-02-11 ENCOUNTER — Ambulatory Visit (HOSPITAL_COMMUNITY)
Admission: RE | Admit: 2015-02-11 | Discharge: 2015-02-11 | Disposition: A | Discharge status: Home / Self Care | Payer: Medicaid Other | Source: Ambulatory Visit | Attending: Family Medicine | Admitting: Family Medicine

## 2015-02-11 ENCOUNTER — Ambulatory Visit (HOSPITAL_COMMUNITY): Payer: Medicaid Other

## 2015-02-11 DIAGNOSIS — Z3482 Encounter for supervision of other normal pregnancy, second trimester: Secondary | ICD-10-CM

## 2015-02-11 DIAGNOSIS — Z3689 Encounter for other specified antenatal screening: Secondary | ICD-10-CM | POA: Insufficient documentation

## 2015-02-11 DIAGNOSIS — Z3A20 20 weeks gestation of pregnancy: Secondary | ICD-10-CM | POA: Insufficient documentation

## 2015-03-20 ENCOUNTER — Other Ambulatory Visit (HOSPITAL_COMMUNITY): Payer: Self-pay | Admitting: Family Medicine

## 2015-03-20 ENCOUNTER — Other Ambulatory Visit (HOSPITAL_COMMUNITY): Payer: Self-pay | Admitting: Maternal and Fetal Medicine

## 2015-03-20 DIAGNOSIS — Z0489 Encounter for examination and observation for other specified reasons: Secondary | ICD-10-CM

## 2015-03-20 DIAGNOSIS — IMO0002 Reserved for concepts with insufficient information to code with codable children: Secondary | ICD-10-CM

## 2015-03-21 ENCOUNTER — Ambulatory Visit (HOSPITAL_COMMUNITY)
Admission: RE | Admit: 2015-03-21 | Discharge: 2015-03-21 | Disposition: A | Discharge status: Home / Self Care | Payer: Medicaid Other | Source: Ambulatory Visit | Attending: Family Medicine | Admitting: Family Medicine

## 2015-03-21 DIAGNOSIS — IMO0002 Reserved for concepts with insufficient information to code with codable children: Secondary | ICD-10-CM

## 2015-03-21 DIAGNOSIS — Z36 Encounter for antenatal screening of mother: Secondary | ICD-10-CM | POA: Diagnosis present

## 2015-03-21 DIAGNOSIS — Z3A25 25 weeks gestation of pregnancy: Secondary | ICD-10-CM | POA: Insufficient documentation

## 2015-03-21 DIAGNOSIS — Z0489 Encounter for examination and observation for other specified reasons: Secondary | ICD-10-CM | POA: Insufficient documentation

## 2015-03-27 ENCOUNTER — Encounter: Payer: Self-pay | Admitting: Certified Nurse Midwife

## 2015-03-30 ENCOUNTER — Encounter (HOSPITAL_COMMUNITY): Payer: Self-pay

## 2015-03-30 ENCOUNTER — Inpatient Hospital Stay (HOSPITAL_COMMUNITY)
Admission: AD | Admit: 2015-03-30 | Discharge: 2015-03-30 | Disposition: A | Discharge status: Home / Self Care | Payer: Medicaid Other | Source: Ambulatory Visit | Attending: Obstetrics & Gynecology | Admitting: Obstetrics & Gynecology

## 2015-03-30 DIAGNOSIS — R102 Pelvic and perineal pain: Secondary | ICD-10-CM | POA: Diagnosis not present

## 2015-03-30 DIAGNOSIS — O9989 Other specified diseases and conditions complicating pregnancy, childbirth and the puerperium: Secondary | ICD-10-CM | POA: Insufficient documentation

## 2015-03-30 DIAGNOSIS — Z3A27 27 weeks gestation of pregnancy: Secondary | ICD-10-CM | POA: Insufficient documentation

## 2015-03-30 DIAGNOSIS — F17210 Nicotine dependence, cigarettes, uncomplicated: Secondary | ICD-10-CM | POA: Diagnosis not present

## 2015-03-30 DIAGNOSIS — O99332 Smoking (tobacco) complicating pregnancy, second trimester: Secondary | ICD-10-CM | POA: Diagnosis not present

## 2015-03-30 NOTE — Discharge Instructions (Signed)

## 2015-03-30 NOTE — MAU Provider Note (Signed)
History   [id number] @ 27 wks. In with c/o sharp intermittant vaginal pain that started this evening.  CSN: [id number]  Arrival date and time: 03/30/15 2230   None     No chief complaint on file.  HPI  OB History    Gravida Para Term Preterm AB TAB SAB Ectopic Multiple Living   5 1 1  0 3 1 2  0 0 1      Past Medical History  Diagnosis Date  . Abnormal Pap smear   . Headache   . Infection     UTI  . Vaginal Pap smear, abnormal     colpo  . Anxiety   . Depression     Past Surgical History  Procedure Laterality Date  . Wisdom tooth extraction      age 29  . Induced abortion      Family History  Problem Relation Age of Onset  . Depression Mother   . Thyroid disease Mother   . Anxiety disorder Mother   . Hypertension Father   . Depression Father   . Anxiety disorder Father   . Depression Sister   . Anxiety disorder Sister   . Hearing loss Maternal Grandmother     age related  . Cancer Paternal Grandmother     History  Substance Use Topics  . Smoking status: Current Every Day Smoker -- 1.00 packs/day for 12 years    Types: Cigarettes  . Smokeless tobacco: Never Used  . Alcohol Use: No     Comment: occassion    Allergies: No Known Allergies  Prescriptions prior to admission  Medication Sig Dispense Refill Last Dose  . acetaminophen (TYLENOL) 500 MG tablet Take 1,000 mg by mouth every 6 (six) hours as needed for mild pain.   Past Week at Unknown time  . Prenatal Vit-Fe Fumarate-FA (PRENATAL MULTIVITAMIN) TABS tablet Take 1 tablet by mouth daily at 12 noon.   03/29/2015 at Unknown time  . sertraline (ZOLOFT) 100 MG tablet Take 100 mg by mouth daily.   03/29/2015 at Unknown time  . acetaminophen-codeine (TYLENOL #3) 300-30 MG per tablet Take 1-2 tablets by mouth every 6 (six) hours as needed for moderate pain. 6 tablet 0   . fluconazole (DIFLUCAN) 150 MG tablet Take 1 tablet (150 mg total) by mouth daily. 1 tablet 0     Review of Systems  Constitutional:  Negative.   HENT: Negative.   Eyes: Negative.   Respiratory: Negative.   Cardiovascular: Negative.   Gastrointestinal: Negative.   Genitourinary:       Sharp shooting vag pain  Skin: Negative.    Physical Exam   Blood pressure 114/70, pulse 97, temperature 98.4 F (36.9 C), resp. rate 17, height 5\' 2"  (1.575 m), weight 171 lb (77.565 kg), last menstrual period 09/03/2014, SpO2 97 %.  Physical Exam  Constitutional: She is oriented to person, place, and time. She appears well-developed and well-nourished.  HENT:  Head: Normocephalic.  Eyes: Pupils are equal, round, and reactive to light.  Neck: Normal range of motion.  Cardiovascular: Normal rate, regular rhythm, normal heart sounds and intact distal pulses.   Respiratory: Effort normal and breath sounds normal.  GI: Soft. Bowel sounds are normal.  Genitourinary: Vagina normal and uterus normal.  Musculoskeletal: Normal range of motion.  Neurological: She is alert and oriented to person, place, and time. She has normal reflexes.  Skin: Skin is warm and dry.  Psychiatric: She has a normal mood and affect. Her behavior is normal.  Judgment and thought content normal.    MAU Course  Procedures  MDM Common discomfort of pregnancy  Assessment and Plan  SVE firm/cl/post high. Will d/c home with reassurring strip  Selena Wang 03/30/2015, 11:03 PM

## 2015-03-30 NOTE — MAU Note (Signed)
Pt reports a lot of pain in her vaginal area x 3-4 days. Denies bleeding.

## 2015-04-01 ENCOUNTER — Encounter: Payer: Self-pay | Admitting: *Deleted

## 2015-04-09 ENCOUNTER — Encounter: Payer: Medicaid Other | Admitting: Certified Nurse Midwife

## 2015-07-09 ENCOUNTER — Encounter (HOSPITAL_COMMUNITY): Payer: Self-pay | Admitting: *Deleted

## 2015-07-09 ENCOUNTER — Inpatient Hospital Stay (HOSPITAL_COMMUNITY)
Admission: AD | Admit: 2015-07-09 | Discharge: 2015-07-09 | Disposition: A | Discharge status: Home / Self Care | Payer: Medicaid Other | Source: Ambulatory Visit | Attending: Obstetrics & Gynecology | Admitting: Obstetrics & Gynecology

## 2015-07-09 ENCOUNTER — Inpatient Hospital Stay (HOSPITAL_COMMUNITY): Payer: Medicaid Other

## 2015-07-09 DIAGNOSIS — F17210 Nicotine dependence, cigarettes, uncomplicated: Secondary | ICD-10-CM | POA: Diagnosis not present

## 2015-07-09 LAB — CBC
HCT: 24.8 % — ABNORMAL LOW (ref 36.0–46.0)
HEMOGLOBIN: 8.2 g/dL — AB (ref 12.0–15.0)
MCH: 29.9 pg (ref 26.0–34.0)
MCHC: 33.1 g/dL (ref 30.0–36.0)
MCV: 90.5 fL (ref 78.0–100.0)
Platelets: 321 10*3/uL (ref 150–400)
RBC: 2.74 MIL/uL — ABNORMAL LOW (ref 3.87–5.11)
RDW: 13.5 % (ref 11.5–15.5)
WBC: 14.3 10*3/uL — ABNORMAL HIGH (ref 4.0–10.5)

## 2015-07-09 LAB — TYPE AND SCREEN
ABO/RH(D): O POS
ANTIBODY SCREEN: NEGATIVE

## 2015-07-09 MED ORDER — MISOPROSTOL 200 MCG PO TABS: 1000.0000 ug | ORAL_TABLET | Freq: Once | ORAL | Status: DC

## 2015-07-09 MED ORDER — LACTATED RINGERS IV BOLUS (SEPSIS)
1000.0000 mL | Freq: Once | INTRAVENOUS | Status: AC
Start: 2015-07-09 — End: 2015-07-09
  Administered 2015-07-09: 1000 mL via INTRAVENOUS

## 2015-07-09 MED ORDER — OXYCODONE-ACETAMINOPHEN 5-325 MG PO TABS: 1.0000 | ORAL_TABLET | Freq: Four times a day (QID) | ORAL | Status: DC | PRN

## 2015-07-09 NOTE — MAU Note (Signed)
Per patient she has been bleeding for 1-2 hours, clots and then starts bleeding again then repeats. Has also had headaches past 3 nights and very tired. She lost 19lbs in a week

## 2015-07-09 NOTE — Discharge Instructions (Signed)
Postpartum Hemorrhage °Postpartum hemorrhage is excessive blood loss after childbirth. Some blood loss is normal after delivering a baby. However, postpartum hemorrhage is a potentially serious condition.  °CAUSES  °· A loss of muscle tone in the uterus after childbirth. °· Failure to deliver all of the placenta. °· Wounds in the birth canal caused by delivery of the fetus. °· A maternal bleeding disorder that prevents blood clotting (rare). °RISK FACTORS °You are at greater risk for postpartum hemorrhage if you: °· Have a history of postpartum hemorrhage. °· Have delivered more than one baby. °· Had preeclampsia or eclampsia. °· Had problems with the placenta. °· Had complications during your labor or delivery. °· Are obese. °· Are Asian or Hispanic. °SIGNS AND SYMPTOMS  °Vaginal bleeding after delivery is normal and should be expected. Bleeding (lochia) will occur for several days after childbirth. This can be expected with normal vaginal deliveries and cesarean deliveries.  °You are bleeding too much after your delivery if you are: °· Passing large clots or pieces of tissue. This may be small pieces of placenta left after delivery.   °· Soaking more than one sanitary pad per hour for several hours.   °· Having heavy, bright-red bleeding that occurs 4 days or more after delivery.   °· Having a discharge that has a bad smell or if you begin to run an unexplained fever.   °· Having times of lightheadedness or fainting, feeling short of breath, or having your heart beat fast with very little activity.   °DIAGNOSIS  °A diagnosis is based on your symptoms and a physical exam of your perineum, vagina, cervix, and uterus. Diagnostic tests may include: °· Blood pressure and pulse. °· Blood tests. °· Blood clotting tests. °· Ultrasonography. °TREATMENT °· Treatment is based on the severity of bleeding and may include: °¨ Uterine massage. °¨ Medicines. °¨ Blood transfusions. °· Sometimes bleeding occurs if portions of the  placenta are left behind in the uterus after delivery. If this happens, often a curettage or scraping of the inside of the uterus must be done. This usually stops the bleeding. If this treatment does not stop the bleeding, surgery (hysterectomy) may have to be performed to remove the uterus. °· If bleeding is due to clotting or bleeding problems that are not related to the pregnancy, other treatments may be needed.   °HOME CARE INSTRUCTIONS  °· Limit your activity as directed by your health care provider. Your health care provider may order bed rest (getting up to the bathroom only) or may allow you to continue light activity.   °· Keep track of the number of pads you use each day and how soaked (saturated) they are. Write this number down.   °· Do not use tampons. Do not douche or have sexual intercourse until approved by your health care provider.   °· Drink enough fluids to keep your urine clear or pale yellow.   °· Get proper amounts of rest.   °· Eat foods that are rich in iron, such as spinach, red meat, and legumes.   °SEEK IMMEDIATE MEDICAL CARE IF: °· You experience severe cramps in your stomach, back, or belly (abdomen).   °· You have a fever.   °· You pass large clots or tissue. Save any tissue for your health care provider to look at.   °· Your bleeding increases. °· You become weak or lightheaded, or you pass out.   °· Your sanitary pad count per hour is increasing. °MAKE SURE YOU: °· Understand these instructions. °· Will watch your condition. °· Will get help right away if   you are not doing well or get worse. °  °This information is not intended to replace advice given to you by your health care provider. Make sure you discuss any questions you have with your health care provider. °  °Document Released: 11/27/2003 Document Revised: 09/11/2013 Document Reviewed: 02/22/2013 °Elsevier Interactive Patient Education ©2016 [employer] Inc[employer] ° °

## 2015-07-09 NOTE — MAU Provider Note (Signed)
History     CSN: [id number]  Arrival date and time: 07/09/15 0111   First Provider Initiated Contact with Patient 07/09/15 0121      Chief Complaint  Patient presents with  . Vaginal Bleeding   HPI  Ms. Selena Wang is a 29 y.o. [id number] who is PPD #16 [facility] who arrives in [facility] be EMS with complaint of heavy vaginal bleeding. The patient states that she had an uncomplicated pregnancy and delivery. She states that this is the 4th episode of heavy bleeding she has had since delivery. [facility] odor and was tested for infection, which per patient was negative. She states continued lower abdominal pain that "feels like contractions." She put on a pad just prior to leaving her home and soaked through in < 1 hour [facility].    OB History    Gravida Para Term Preterm AB TAB SAB Ectopic Multiple Living   5 1 1  0 3 1 2  0 0 2      Past Medical History  Diagnosis Date  . Abnormal Pap smear   . Headache   . Infection     UTI  . Vaginal Pap smear, abnormal     colpo  . Anxiety   . Depression     Past Surgical History  Procedure Laterality Date  . Wisdom tooth extraction      age 29  . Induced abortion      Family History  Problem Relation Age of Onset  . Depression Mother   . Thyroid disease Mother   . Anxiety disorder Mother   . Hypertension Father   . Depression Father   . Anxiety disorder Father   . Depression Sister   . Anxiety disorder Sister   . Hearing loss Maternal Grandmother     age related  . Cancer Paternal Grandmother     Social History  Substance Use Topics  . Smoking status: Current Every Day Smoker -- 1.00 packs/day for 12 years    Types: Cigarettes  . Smokeless tobacco: Never Used  . Alcohol Use: No     Comment: occassion    Allergies: No Known Allergies  Prescriptions prior to admission  Medication Sig Dispense Refill Last Dose  . acetaminophen (TYLENOL)  500 MG tablet Take 1,000 mg by mouth every 6 (six) hours as needed for mild pain.   Past Week at Unknown time  . acetaminophen-codeine (TYLENOL #3) 300-30 MG per tablet Take 1-2 tablets by mouth every 6 (six) hours as needed for moderate pain. 6 tablet 0   . fluconazole (DIFLUCAN) 150 MG tablet Take 1 tablet (150 mg total) by mouth daily. 1 tablet 0   . Prenatal Vit-Fe Fumarate-FA (PRENATAL MULTIVITAMIN) TABS tablet Take 1 tablet by mouth daily at 12 noon.   03/29/2015 at Unknown time  . sertraline (ZOLOFT) 100 MG tablet Take 100 mg by mouth daily.   03/29/2015 at Unknown time    Review of Systems  Constitutional: Positive for fever and malaise/fatigue.  Gastrointestinal: Positive for abdominal pain.  Genitourinary:       + vaginal bleeding  Neurological: Positive for weakness. Negative for dizziness and loss of consciousness.   Physical Exam   Blood pressure 105/65, pulse 90, temperature 98.3 F (36.8 C), temperature source Oral, resp. rate 18, last menstrual period 09/03/2014, SpO2 100 %, unknown if currently breastfeeding.  Physical Exam  Nursing note and vitals reviewed.  Constitutional: She is oriented to person, place, and time. She appears well-developed and well-nourished. No distress.  HENT:  Head: Normocephalic and atraumatic.  Cardiovascular: Normal rate.   Respiratory: Effort normal.  GI: Soft. She exhibits no distension and no mass. There is no tenderness. There is no rebound and no guarding.  Genitourinary:  Pad is soaked on arrival. New pad placed for continued assessment.   Neurological: She is alert and oriented to person, place, and time.  Skin: Skin is warm and dry. She is not diaphoretic. No erythema. There is pallor.  Psychiatric: She has a normal mood and affect.   Results for orders placed or performed during the hospital encounter of 07/09/15 (from the past 24 hour(s))  CBC     Status: Abnormal   Collection Time: 07/09/15  1:25 AM  Result Value Ref Range   WBC  14.3 (H) 4.0 - 10.5 K/uL   RBC 2.74 (L) 3.87 - 5.11 MIL/uL   Hemoglobin 8.2 (L) 12.0 - 15.0 g/dL   HCT 45.4 (L) 09.8 - 11.9 %   MCV 90.5 78.0 - 100.0 fL   MCH 29.9 26.0 - 34.0 pg   MCHC 33.1 30.0 - 36.0 g/dL   RDW 14.7 82.9 - 56.2 %   Platelets 321 150 - 400 K/uL  Type and screen [facility]     Status: None   Collection Time: 07/09/15  1:25 AM  Result Value Ref Range   ABO/RH(D) O POS    Antibody Screen NEG    Sample Expiration 07/12/2015    US Pelvis Complete  07/09/2015  CLINICAL DATA:  Postpartum vaginal bleeding post delivery 06/22/2015. EXAM: TRANSABDOMINAL ULTRASOUND OF PELVIS TECHNIQUE: Transabdominal ultrasound examination of the pelvis was performed including evaluation of the uterus, ovaries, adnexal regions, and pelvic cul-de-sac. COMPARISON:  None. FINDINGS: Uterus Measurements: 12.3 x 6.7 x 7.1 cm. No fibroids or other mass visualized. Endometrium Thickness: Thickened measuring 3.6 cm and heterogeneous. There is some blood flow to the heterogeneous material. Heterogeneous debris seen in the vagina. Right ovary Measurements: 2.3 x 1.8 x 2.8 cm. Normal appearance/no adnexal mass. Left ovary Measurements: 3.0 x 1.7 x 2.0 cm. Normal appearance/no adnexal mass. Other findings:  No free fluid IMPRESSION: Heterogeneous thickening of the endometrium with internal blood flow, concerning for retained products of conception. These results were called by telephone at the time of interpretation on 07/09/2015 at 2:24 am toPA JULIE WENZEL , who verbally acknowledged these results. Electronically Signed   By: Rubye Oaks M.D.   On: 07/09/2015 02:25    MAU Course  Procedures None  MDM IV LR CBC, ABO/Rh and [place] ordered Bedside US requested Discussed labs and [place] results with Dr. Jolayne Panther. Recommends discharge at this time with Rx for Cytotec 1000 mcg PO and bleeding precautions. Patient should follow-up with OB/Gyn at [facility] as scheduled tomorrow.   Assessment and  Plan  A: Postpartum bleeding Concern for retained products of conception  P: Discharge home Rx for Cytotec and Percocet given to patient Bleeding precautions discussed Patient advised to follow-up with [facility] as scheduled tomorrow for follow-up and further management Patient may return to MAU as needed or if her condition were to change or worsen   Marny Lowenstein, PA-C  07/09/2015, 2:43 AM

## 2015-12-30 IMAGING — US US OB TRANSVAGINAL
1 series · 14 of 28 positions shown · non-contrast
Comparison: None.

CLINICAL DATA: Cramping for 2 weeks.

EXAM:
OBSTETRIC <14 WK US AND TRANSVAGINAL OB US
TECHNIQUE: Both transabdominal and transvaginal ultrasound examinations were
performed for complete evaluation of the gestation as well as the
maternal uterus, adnexal regions, and pelvic cul-de-sac.
Transvaginal technique was performed to assess early pregnancy.

[Series 1: us ob comp less 14 wks · 14 of 96 slices shown]
[im 4/96]
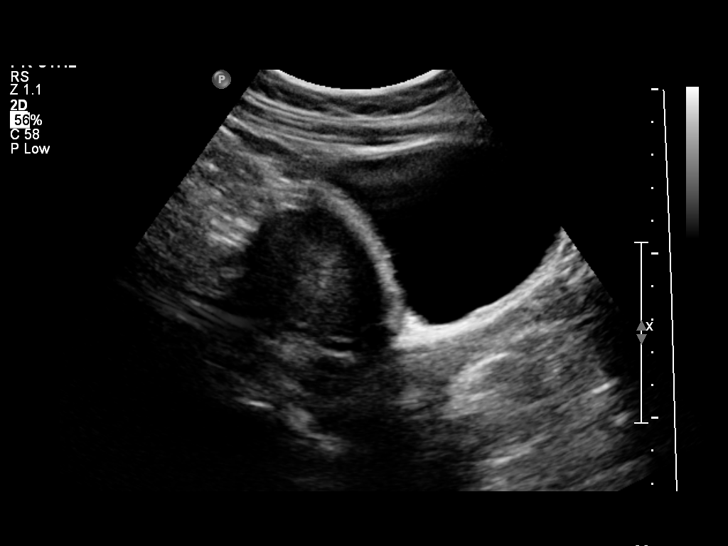
[im 11/96]
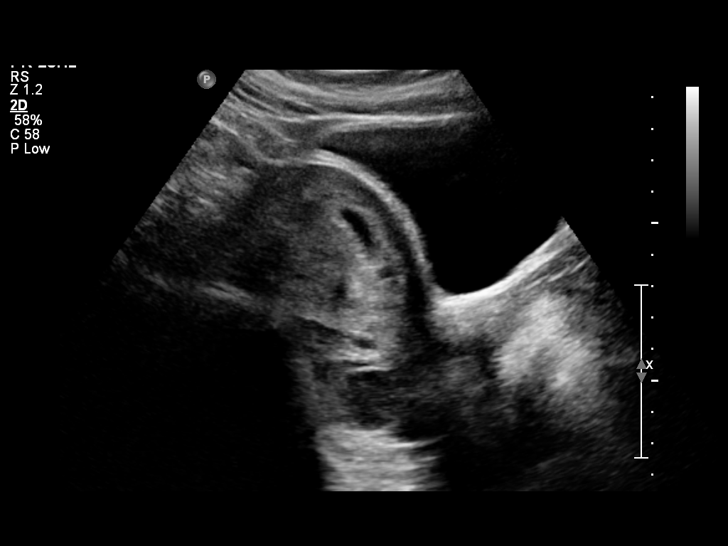
[im 18/96]
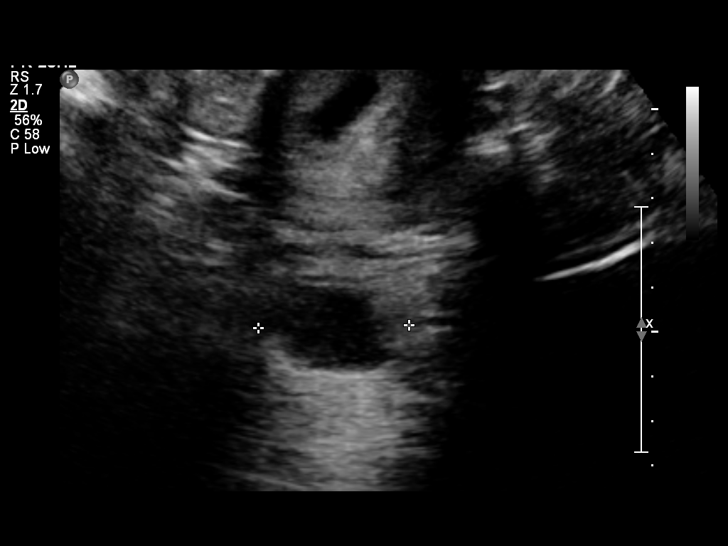
[im 25/96]
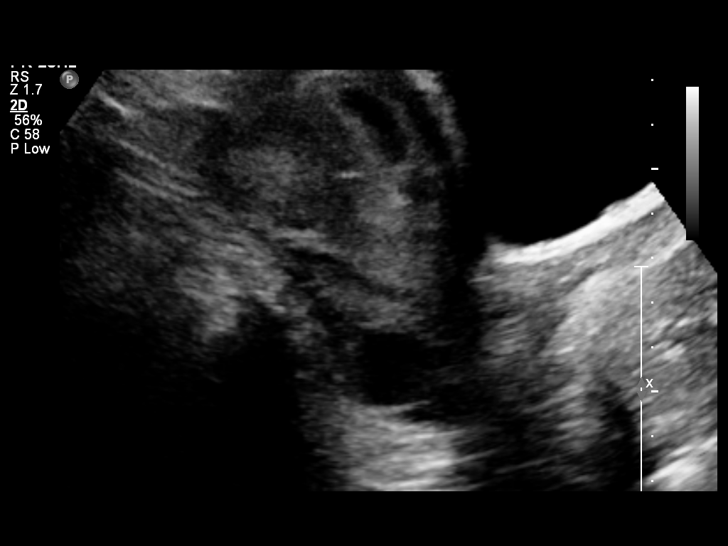
[im 32/96]
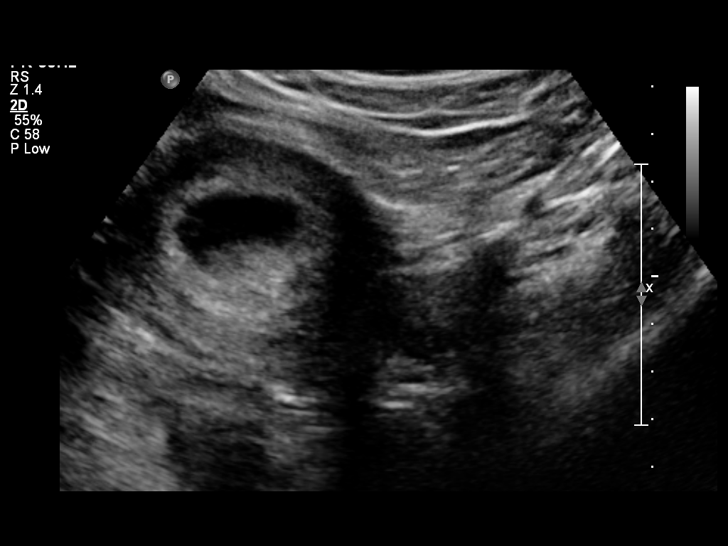
[im 39/96]
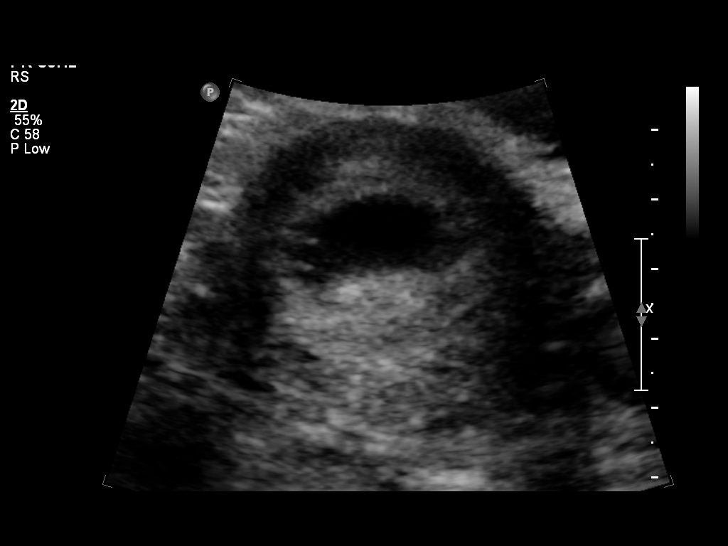
[im 46/96]
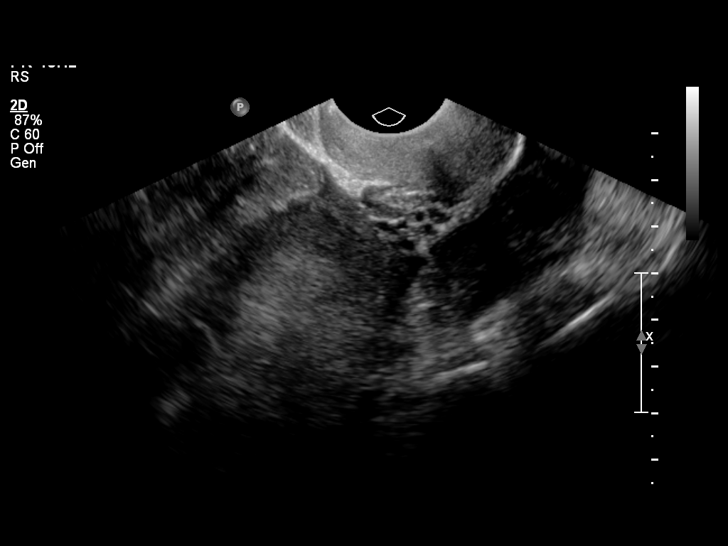
[im 53/96]
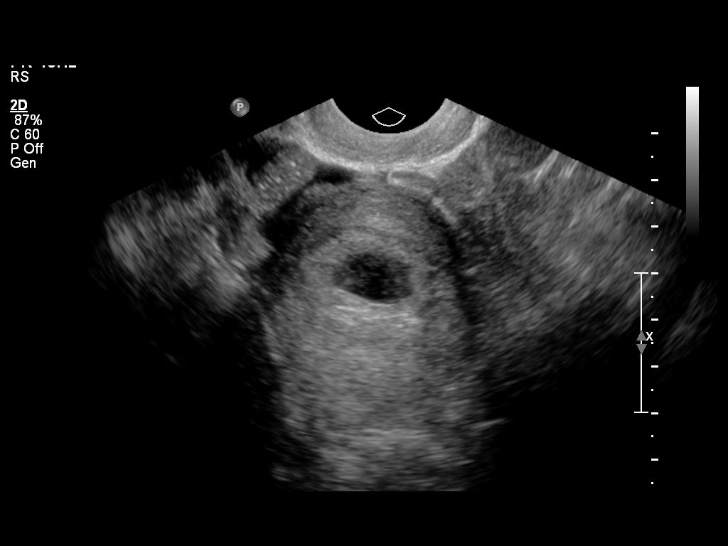
[im 60/96]
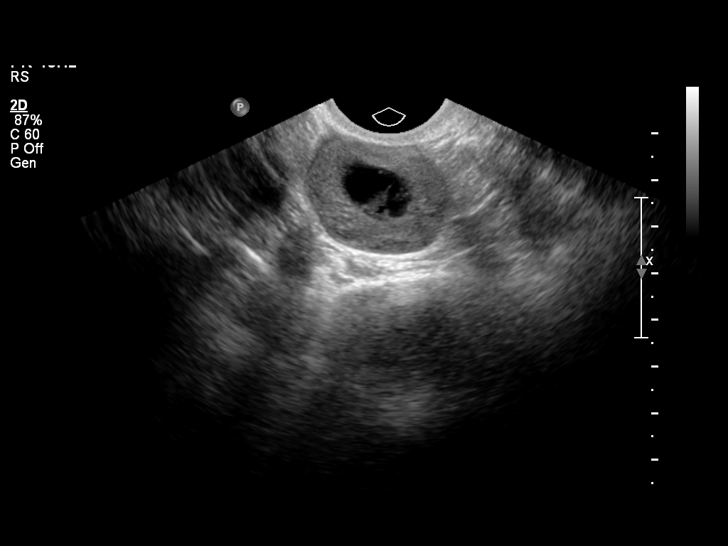
[im 67/96]
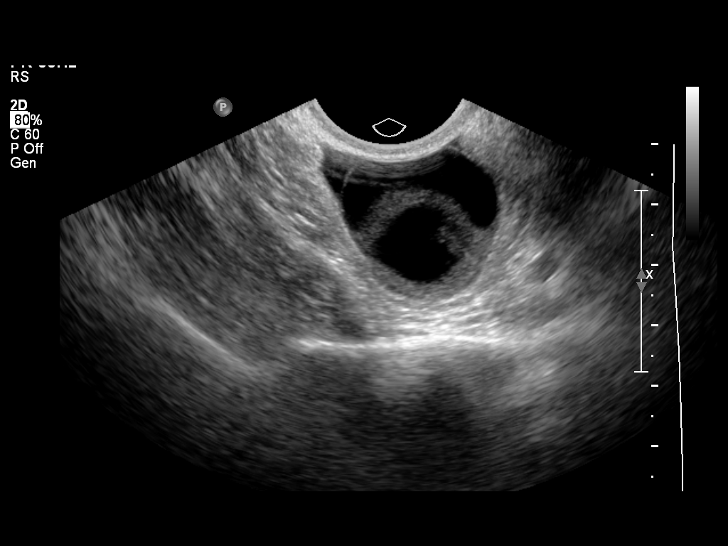
[im 74/96]
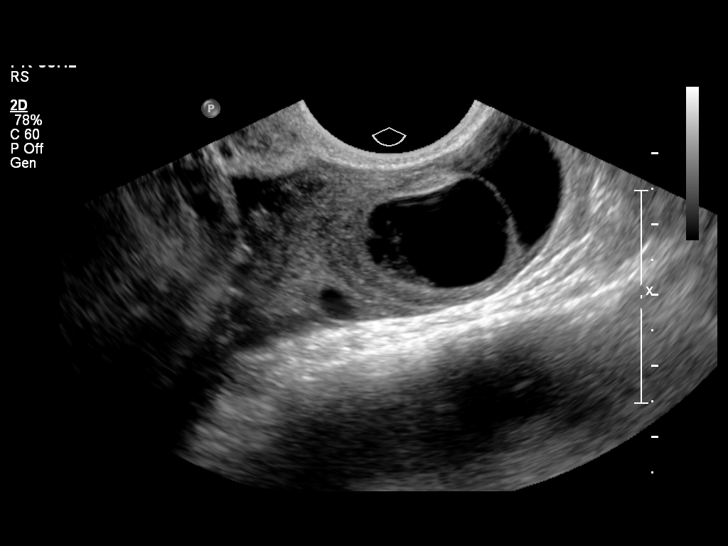
[im 81/96]
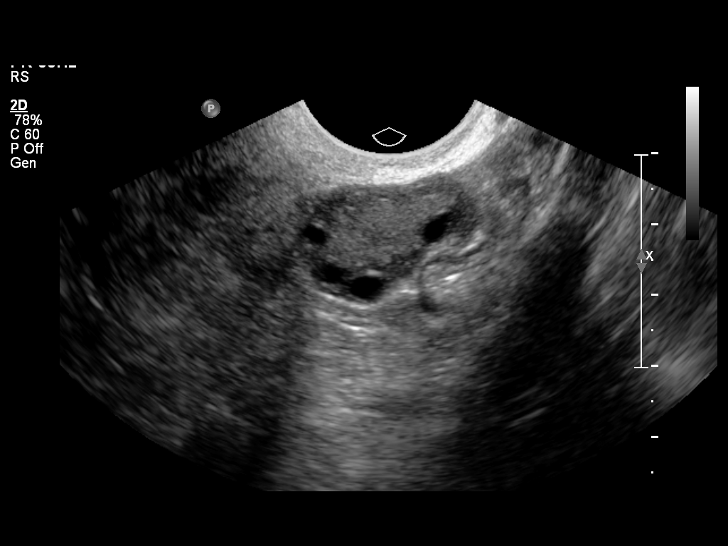
[im 88/96]
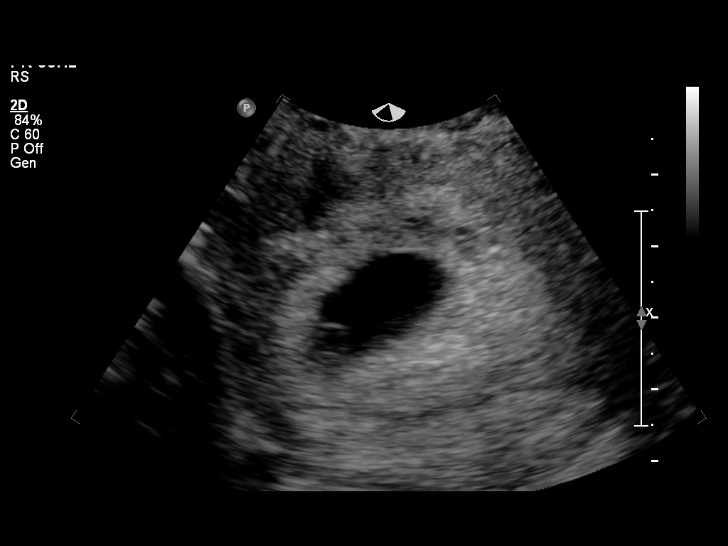
[im 96/96]
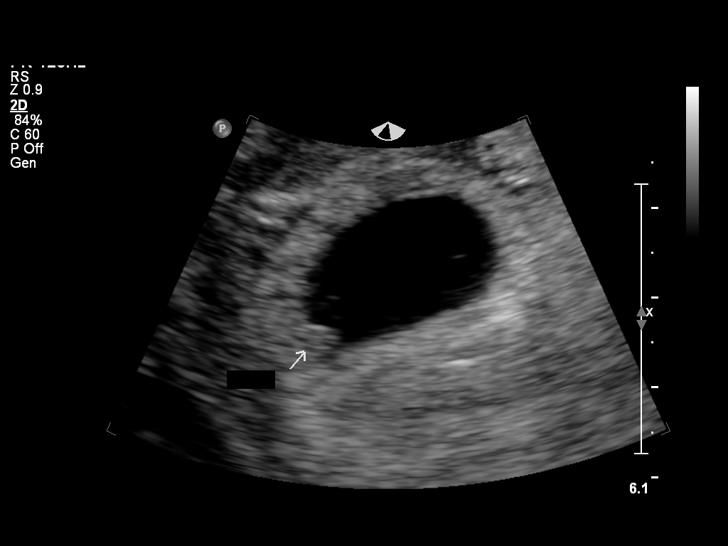

[14 of 28 positions shown; findings below may reference images not displayed]

FINDINGS: Intrauterine gestational sac: Single

Yolk sac:  Yes

Embryo:  Yes

Cardiac Activity: Yes

Heart Rate: 108  bpm

CRL:  4.0 mm   6 w   1 d                  US EDC: 06/29/2015

Maternal uterus/adnexae:

Subchorionic hemorrhage: None

Right ovary: Normal.  Multiple cysts noted.

Left ovary: Normal

Other :Normal

Free fluid:  Trace free fluid noted.
IMPRESSION: 1. Single living intrauterine gestation. The estimated gestational
age is 6 weeks and 1 day.

## 2016-03-08 ENCOUNTER — Other Ambulatory Visit: Payer: Self-pay | Admitting: Surgery

## 2016-04-07 IMAGING — US US OB COMP +14 WK
1 series · 12 of 28 positions shown · non-contrast
Comparison: none

[Series 1: us ob +14 all · 12 of 77 slices shown]
[im 3/77]
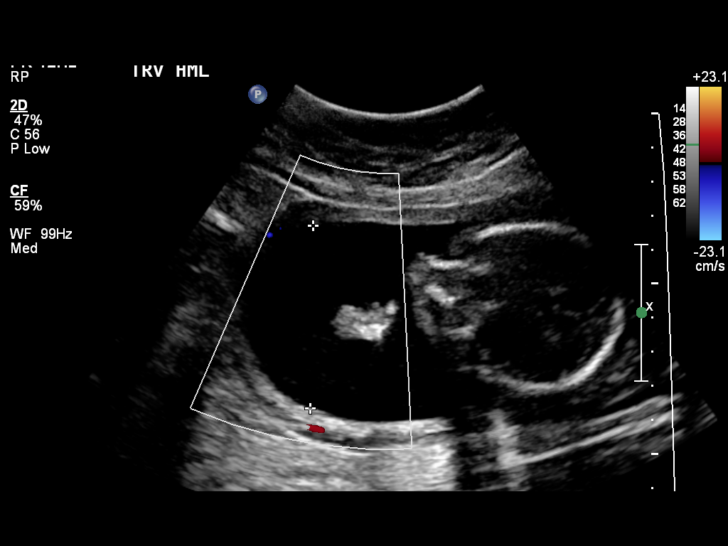
[im 9/77]
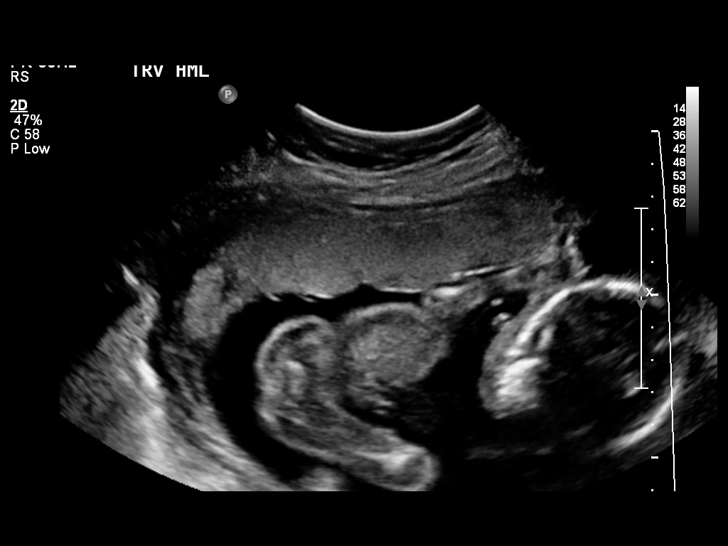
[im 15/77]
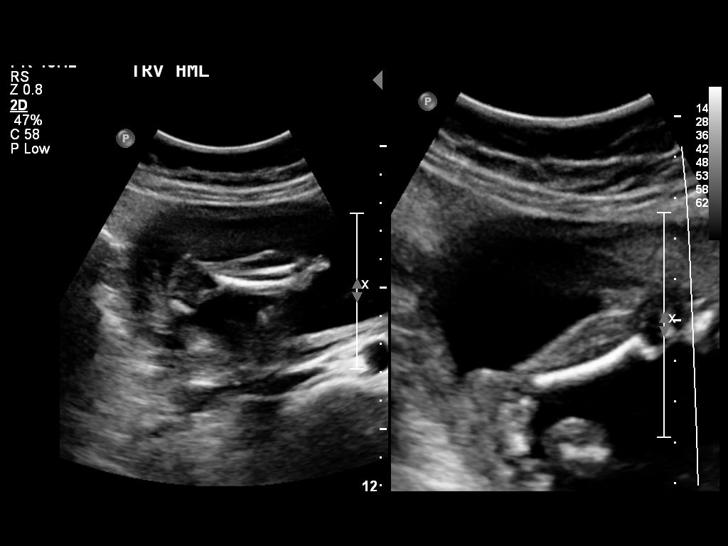
[im 23/77]
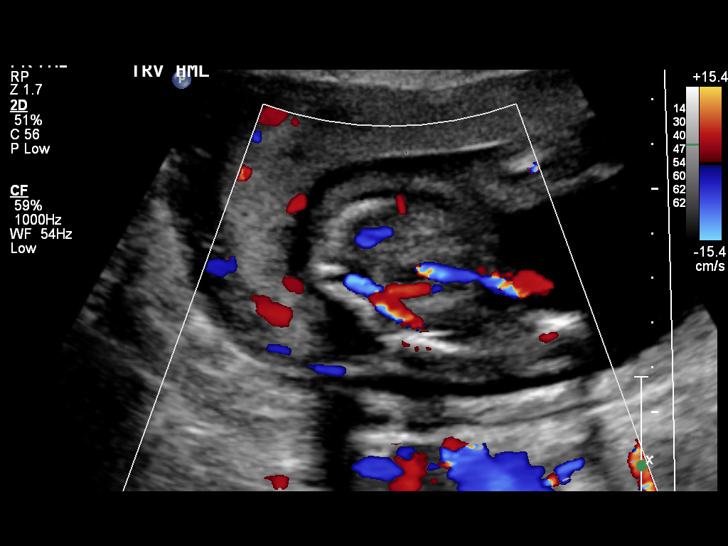
[im 29/77]
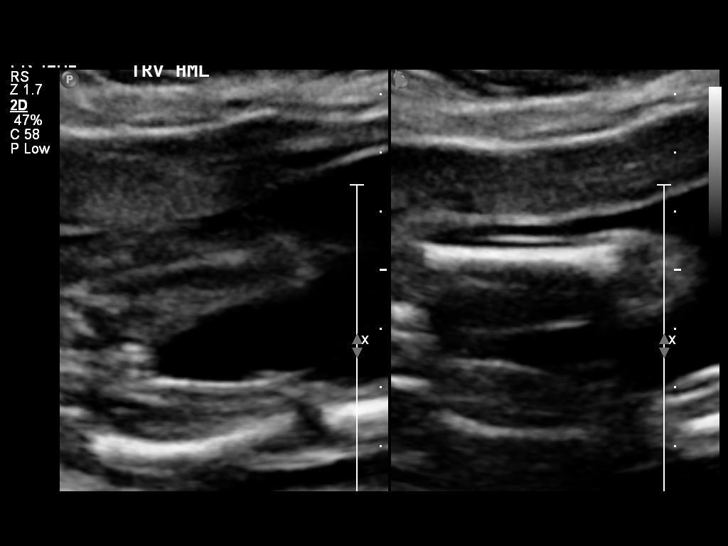
[im 34/77]
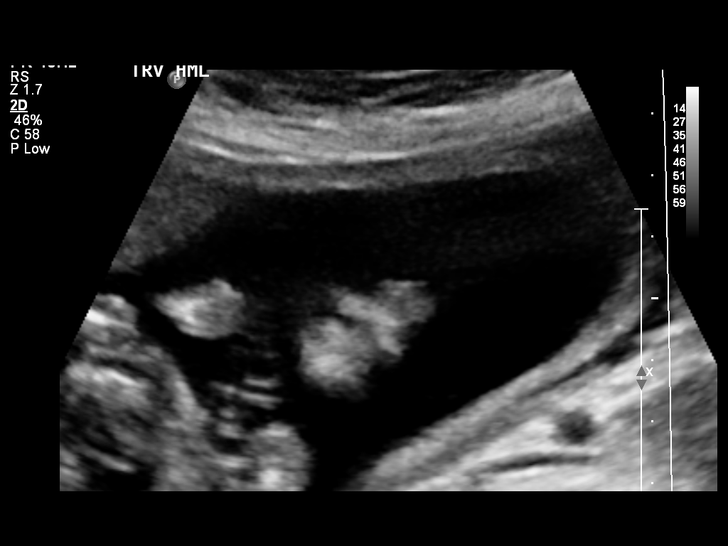
[im 43/77]
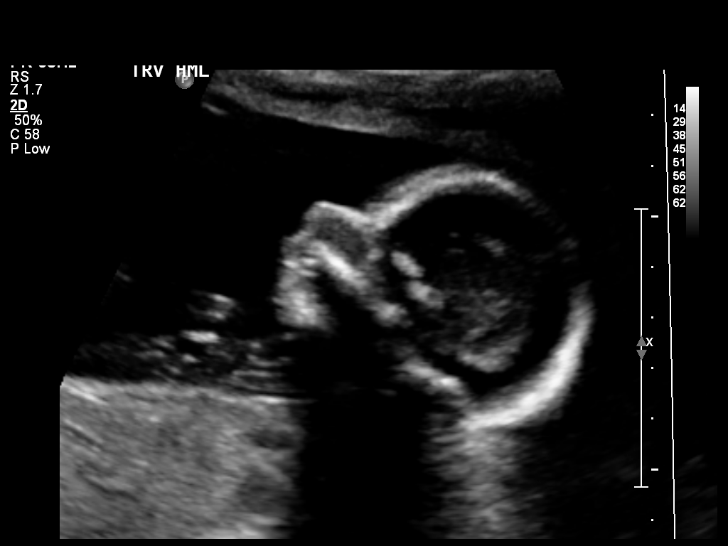
[im 48/77]
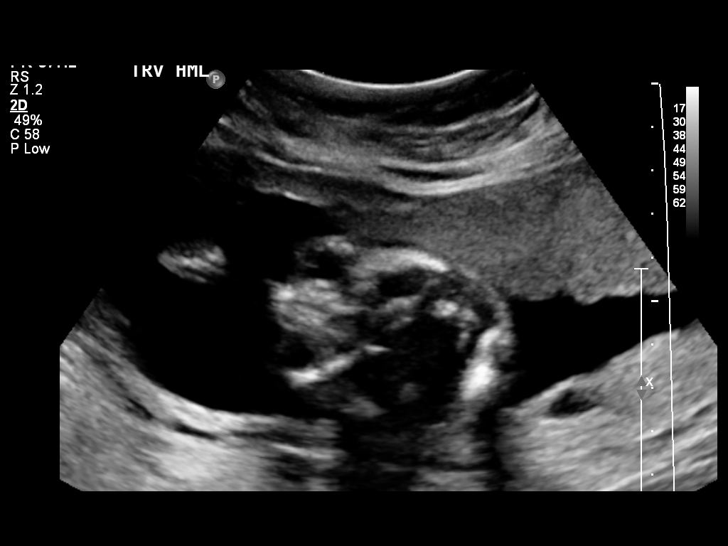
[im 54/77]
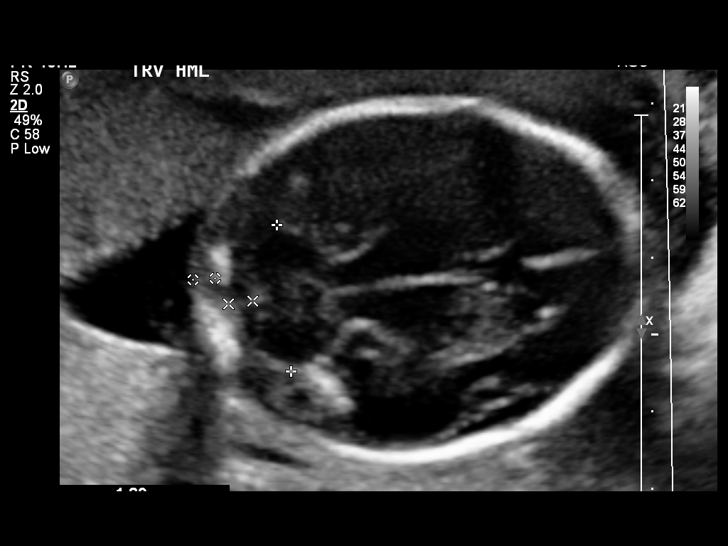
[im 62/77]
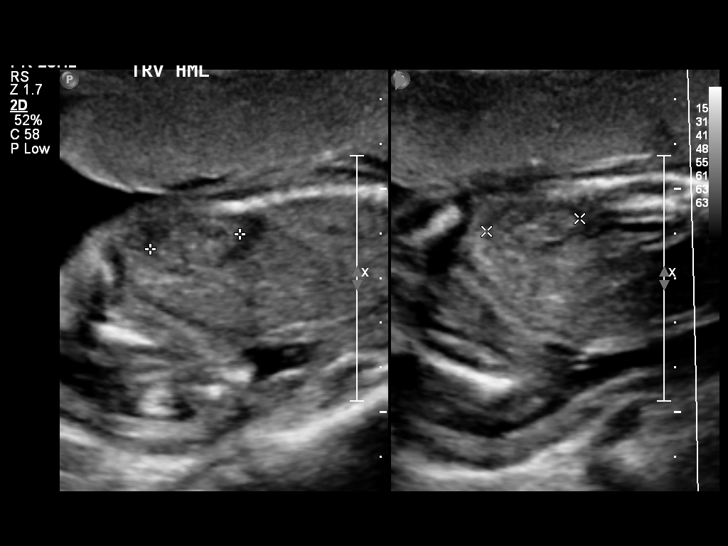
[im 68/77]
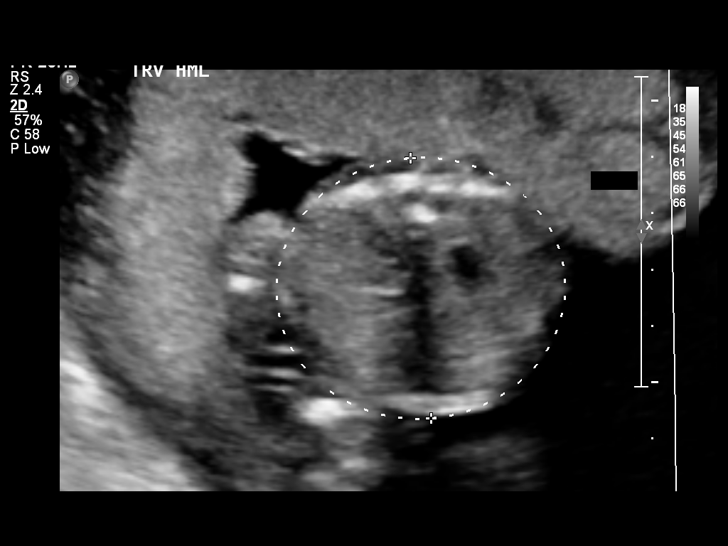
[im 74/77]
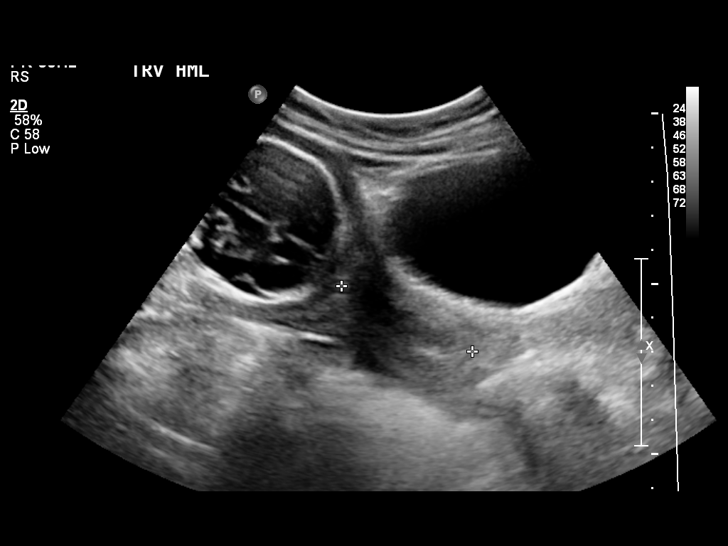

[12 of 28 positions shown; findings below may reference images not displayed]

OBSTETRICS REPORT

Service(s) Provided

US OB COMP + 14 WK                                    76805.1
Indications

20 weeks gestation of pregnancy
Basic anatomic survey                                 [id number]
Fetal Evaluation

Num Of Fetuses:    1
Fetal Heart Rate:  158                          bpm
Cardiac Activity:  Observed
Presentation:      Variable
Placenta:          Anterior Right, above
cervical os
P. Cord            Visualized, central
Insertion:

Amniotic Fluid
AFI FV:      Subjectively within normal limits
Larg Pckt:    5.34  cm
Biometry

BPD:       46  mm     G. Age:  19w 6d                CI:         72.6   70 - 86
FL/HC:      18.7   16.8 -
19.8
HC:     171.7  mm     G. Age:  19w 5d       20  %    HC/AC:      1.13   1.09 -
1.39
AC:     151.6  mm     G. Age:  20w 3d       47  %    FL/BPD:
FL:      32.1  mm     G. Age:  20w 0d       32  %    FL/AC:      21.2   20 - 24
HUM:     31.6  mm     G. Age:  20w 4d       58  %
NFT:     2.84  mm

Est. FW:     335  gm    0 lb 12 oz      45  %
Gestational Age

LMP:           23w 0d        Date:  09/03/14                 EDD:   06/10/15
U/S Today:     20w 0d                                        EDD:   07/01/15
Best:          20w 2d     Det. By:  Early Ultrasound         EDD:   06/29/15
(11/04/14)
Anatomy
Cranium:          Appears normal         Aortic Arch:      Appears normal
Fetal Cavum:      Appears normal         Ductal Arch:      Appears normal
Ventricles:       Appears normal         Diaphragm:        Not well visualized
Choroid Plexus:   Appears normal         Stomach:          Appears normal, left
sided
Cerebellum:       Appears normal         Abdomen:          Appears normal
Posterior Fossa:  Appears normal         Abdominal Wall:   Appears nml (cord
insert, abd wall)
Nuchal Fold:      Not applicable (>20    Cord Vessels:     Appears normal (3
wks GA)                                  vessel cord)
Face:             Appears normal         Kidneys:          Appear normal
(orbits and profile)
Lips:             Appears normal         Bladder:          Appears normal
Heart:            Not well visualized    Spine:            Appears normal
RVOT:             Not well visualized    Lower             Appears normal
Extremities:
LVOT:             Appears normal         Upper             Appears normal
Extremities:

Other:  Fetus appears to be a female. Heels visualized. Nasal bone
visualized. Technically difficult due to fetal position.
Targeted Anatomy

Fetal Central Nervous System
Cisterna Magna:
Cervix Uterus Adnexa

Cervical Length:    4.27     cm

Cervix:       Normal appearance by transabdominal scan.
Uterus:       No abnormality visualized.
Cul De Sac:   No free fluid seen.
Left Ovary:    Within normal limits.
Right Ovary:   Within normal limits.
Adnexa:     No abnormality visualized.
Impression

Single IUP at 20w 2d
Limiited views of the fetal heart and diaphragm obtained
The remainder of the fetal anatomy appears normal
Right anterior placenta without previa
Normal amniotic fluid volume
Recommendations

Recommend follow-up ultrasound examination in 4 weeks to
complete anatomy

WHIT with us.  Please do not hesitate to contact
Attending Physician, CITRADEWI

## 2016-09-02 IMAGING — US US PELVIS COMPLETE
1 series · 15 of 25 positions shown · non-contrast
Comparison: None.

CLINICAL DATA: Postpartum vaginal bleeding post delivery
06/22/2015.

EXAM:
TRANSABDOMINAL ULTRASOUND OF PELVIS
TECHNIQUE: Transabdominal ultrasound examination of the pelvis was performed
including evaluation of the uterus, ovaries, adnexal regions, and
pelvic cul-de-sac.

[Series 1: us pelvis complete · 55 acquisitions, 15 frames shown]
[im 1/55]
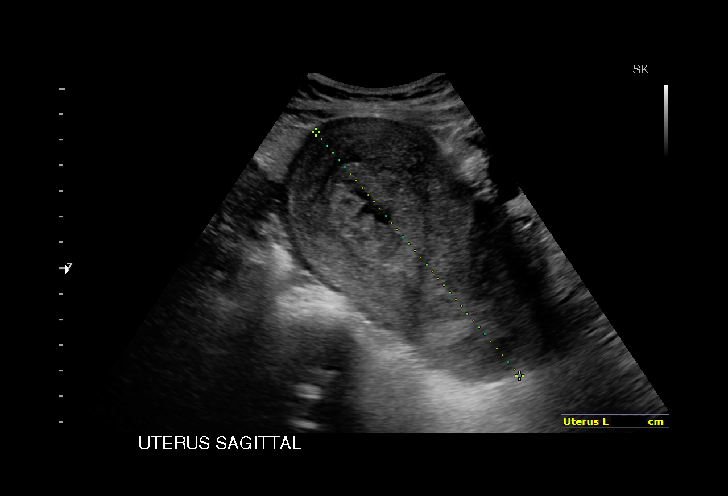
[im 5/55]
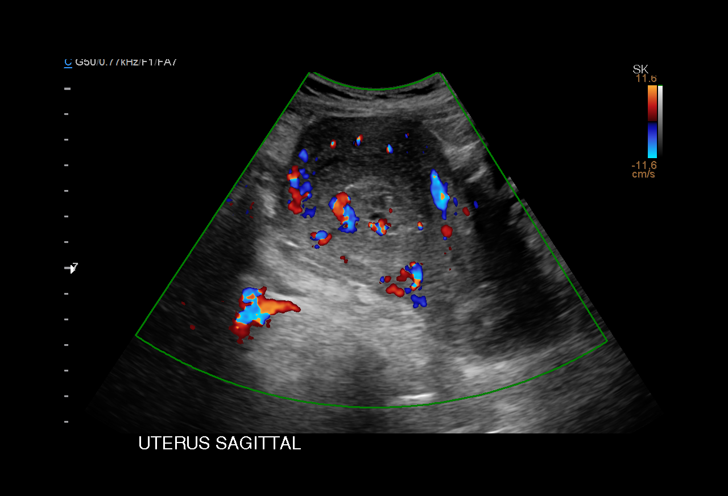
[im 10/55]
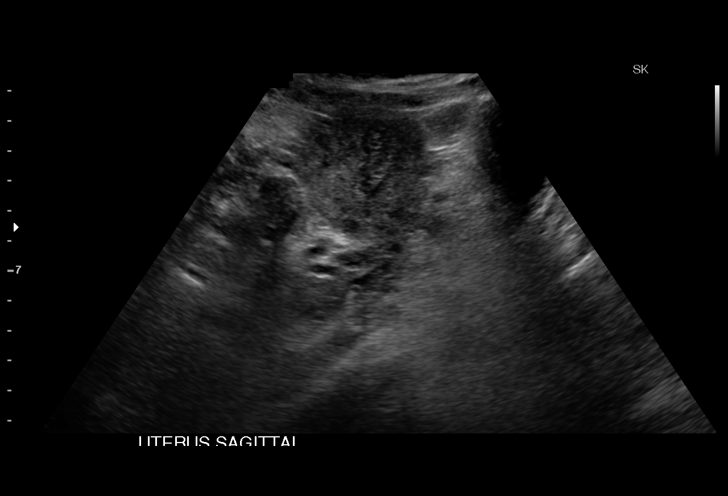
[im 12/55]
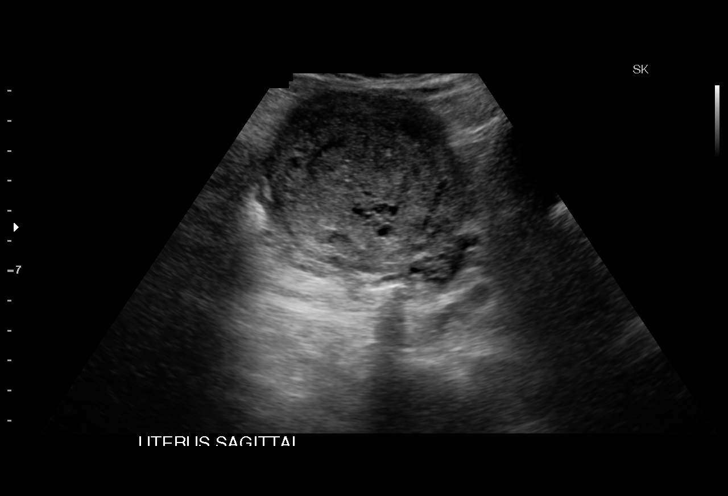
[im 16/55]
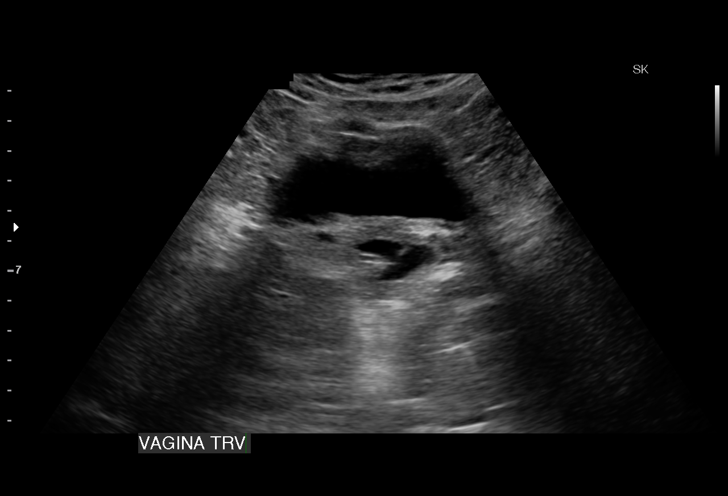
[im 21/55]
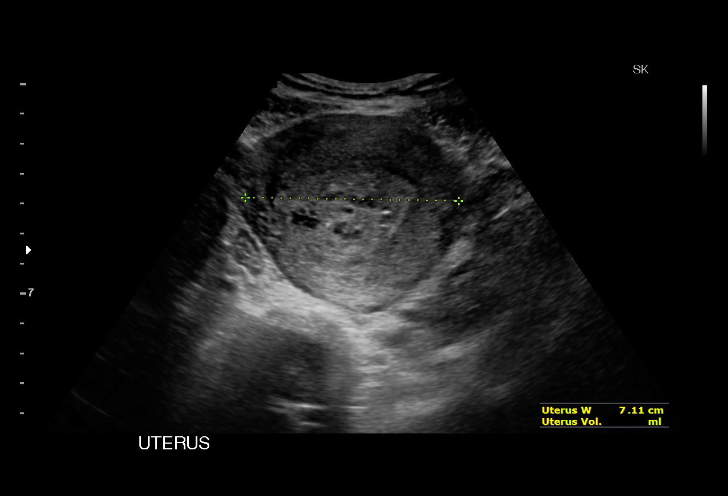
[im 23/55]
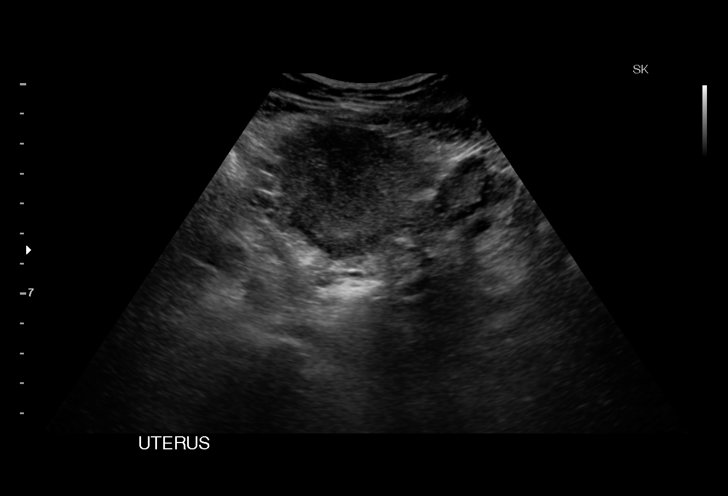
[im 28/55]
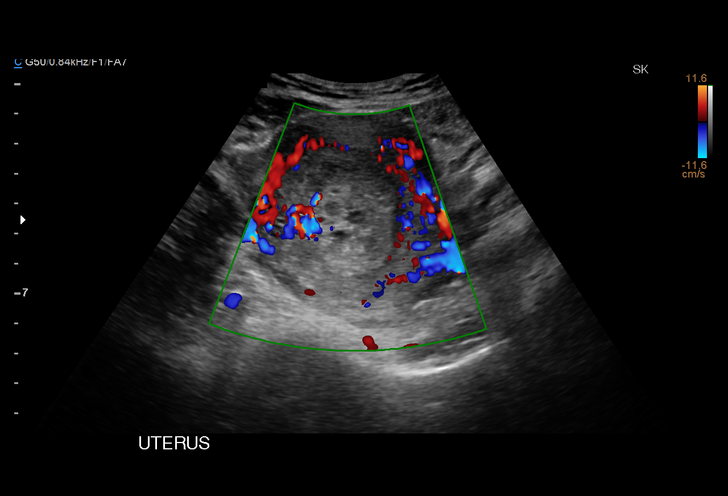
[im 32/55]
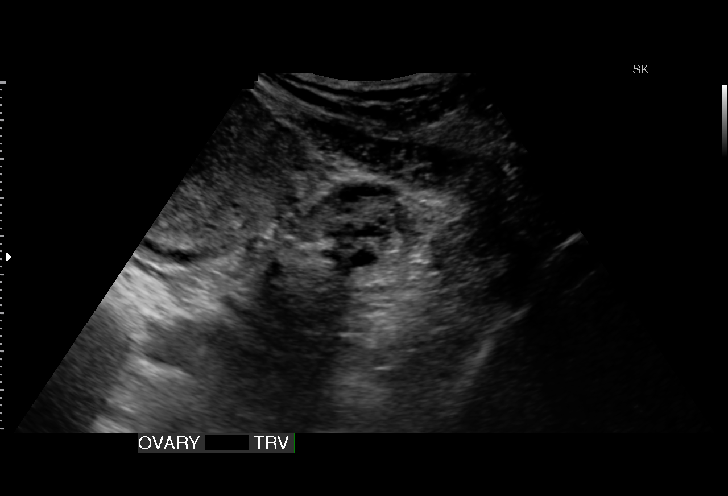
[im 34/55]
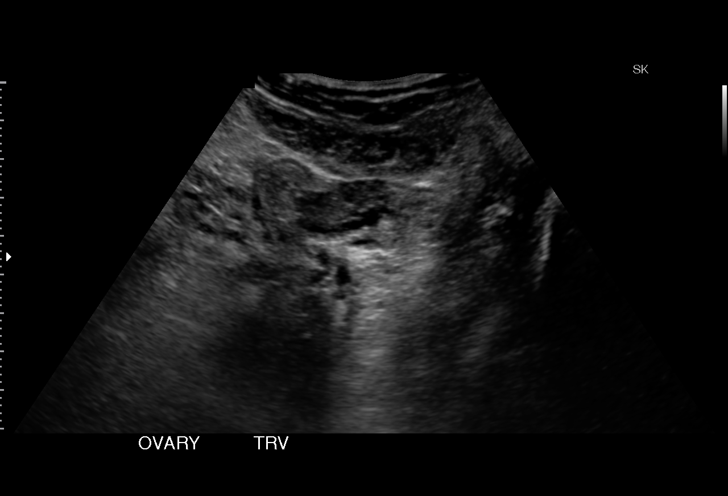
[im 39/55]
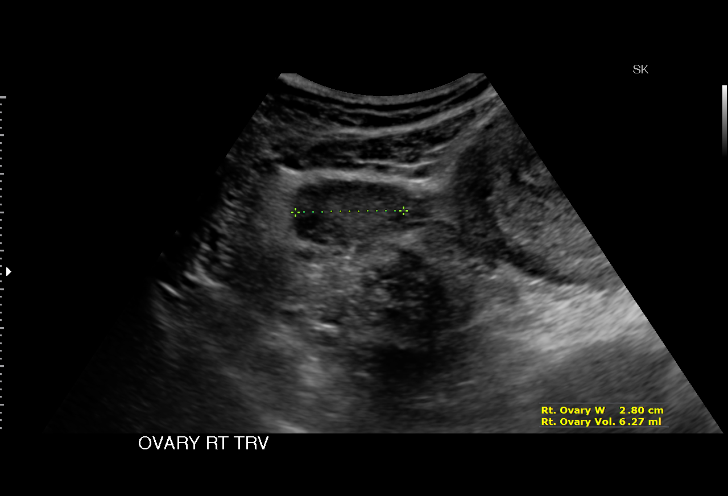
[im 43/55]
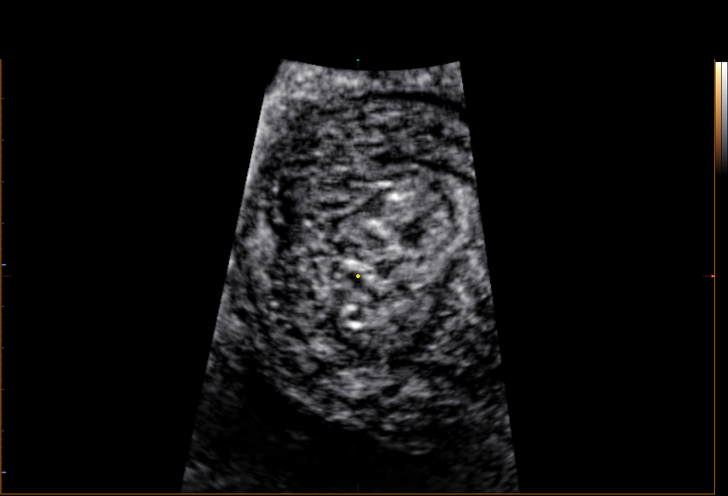
[im 46/55]
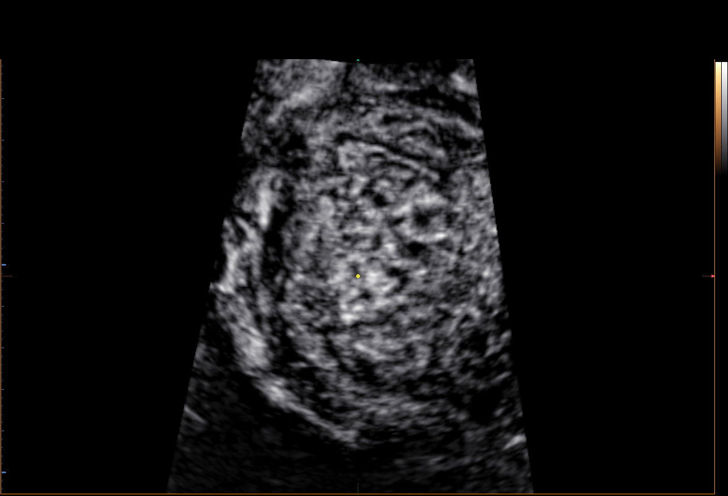
[im 50/55]
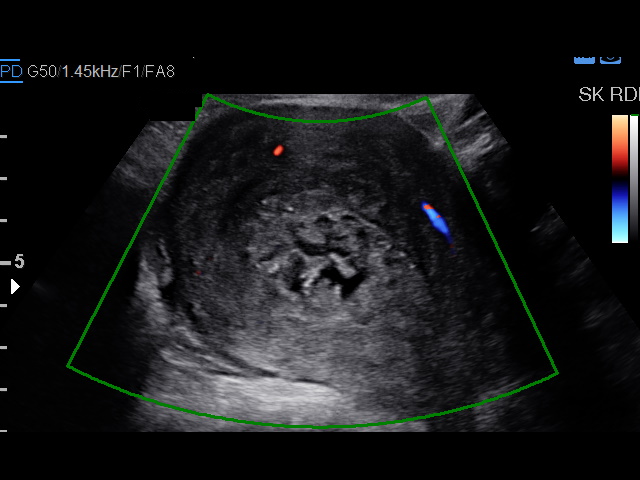
[im 55/55]
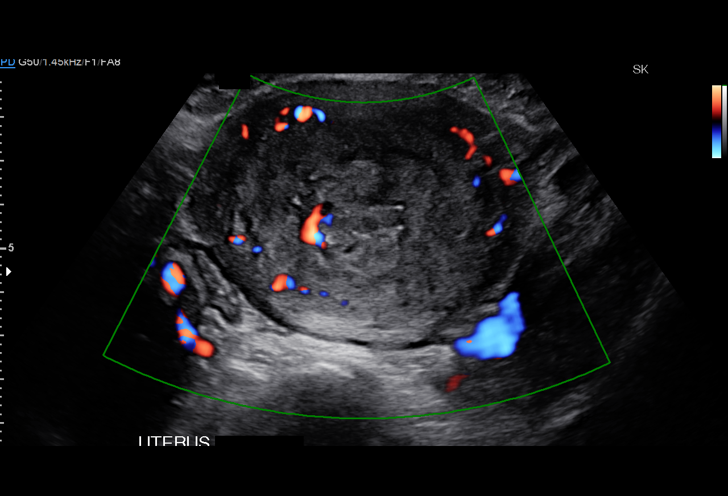

[15 of 25 positions shown; findings below may reference images not displayed]

FINDINGS: Uterus

Measurements: 12.3 x 6.7 x 7.1 cm. No fibroids or other mass
visualized.

Endometrium

Thickness: Thickened measuring 3.6 cm and heterogeneous. There is
some blood flow to the heterogeneous material. Heterogeneous debris
seen in the vagina.

Right ovary

Measurements: 2.3 x 1.8 x 2.8 cm. Normal appearance/no adnexal mass.

Left ovary

Measurements: 3.0 x 1.7 x 2.0 cm. Normal appearance/no adnexal mass.

Other findings:  No free fluid
IMPRESSION: Heterogeneous thickening of the endometrium with internal blood
flow, concerning for retained products of conception.

These results were called by telephone at the time of interpretation
on 07/09/2015 at [DATE] Alyaa Deghady , who verbally
acknowledged these results.

## 2020-04-12 ENCOUNTER — Encounter (HOSPITAL_COMMUNITY): Payer: Self-pay

## 2020-04-12 ENCOUNTER — Emergency Department (HOSPITAL_COMMUNITY)
Admission: EM | Admit: 2020-04-12 | Discharge: 2020-04-14 | Disposition: A | Discharge status: Other Acute Inpatient Hospital | Payer: Medicaid Other | Attending: Emergency Medicine | Admitting: Emergency Medicine

## 2020-04-12 DIAGNOSIS — F332 Major depressive disorder, recurrent severe without psychotic features: Secondary | ICD-10-CM | POA: Insufficient documentation

## 2020-04-12 DIAGNOSIS — Z20822 Contact with and (suspected) exposure to covid-19: Secondary | ICD-10-CM | POA: Insufficient documentation

## 2020-04-12 DIAGNOSIS — R45850 Homicidal ideations: Secondary | ICD-10-CM

## 2020-04-12 DIAGNOSIS — F17210 Nicotine dependence, cigarettes, uncomplicated: Secondary | ICD-10-CM | POA: Diagnosis not present

## 2020-04-12 DIAGNOSIS — I82220 Acute embolism and thrombosis of inferior vena cava: Secondary | ICD-10-CM | POA: Diagnosis present

## 2020-04-12 LAB — COMPREHENSIVE METABOLIC PANEL
ALT: 15 U/L (ref 0–44)
AST: 17 U/L (ref 15–41)
Albumin: 4 g/dL (ref 3.5–5.0)
Alkaline Phosphatase: 62 U/L (ref 38–126)
Anion gap: 8 (ref 5–15)
BUN: 9 mg/dL (ref 6–20)
CO2: 21 mmol/L — ABNORMAL LOW (ref 22–32)
Calcium: 8.8 mg/dL — ABNORMAL LOW (ref 8.9–10.3)
Chloride: 110 mmol/L (ref 98–111)
Creatinine, Ser: 0.65 mg/dL (ref 0.44–1.00)
GFR calc Af Amer: >60 mL/min (ref >60)
GFR calc non Af Amer: >60 mL/min (ref >60)
Glucose, Bld: 130 mg/dL — ABNORMAL HIGH (ref 70–99)
Potassium: 4.3 mmol/L (ref 3.5–5.1)
Sodium: 139 mmol/L (ref 135–145)
Total Bilirubin: 0.4 mg/dL (ref 0.3–1.2)
Total Protein: 6.3 g/dL — ABNORMAL LOW (ref 6.5–8.1)

## 2020-04-12 LAB — CBC
HCT: 43.9 % (ref 36.0–46.0)
Hemoglobin: 14 g/dL (ref 12.0–15.0)
MCH: 30.6 pg (ref 26.0–34.0)
MCHC: 31.9 g/dL (ref 30.0–36.0)
MCV: 95.9 fL (ref 80.0–100.0)
Platelets: 219 10*3/uL (ref 150–400)
RBC: 4.58 MIL/uL (ref 3.87–5.11)
RDW: 12.1 % (ref 11.5–15.5)
WBC: 12.6 10*3/uL — ABNORMAL HIGH (ref 4.0–10.5)
nRBC: 0 % (ref 0.0–0.2)

## 2020-04-12 LAB — I-STAT BETA HCG BLOOD, ED (MC, WL, AP ONLY): I-stat hCG, quantitative: <5 m[IU]/mL (ref <5)

## 2020-04-12 LAB — ETHANOL: Alcohol, Ethyl (B): <10 mg/dL (ref <10)

## 2020-04-12 MED ORDER — LORAZEPAM 1 MG PO TABS
1.0000 mg | ORAL_TABLET | Freq: Once | ORAL | Status: AC
  Administered 2020-04-12: 1 mg via ORAL
  Filled 2020-04-12: qty 1

## 2020-04-12 NOTE — ED Notes (Signed)
1 bag to locker 1
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
## 2020-04-12 NOTE — ED Notes (Signed)
Please give Desere Gwin (husband) a call at [phone] for an update

## 2020-04-12 NOTE — ED Notes (Signed)
Pt crying on the phone to family
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
## 2020-04-13 ENCOUNTER — Other Ambulatory Visit: Payer: Self-pay

## 2020-04-13 ENCOUNTER — Encounter (HOSPITAL_COMMUNITY): Payer: Self-pay | Admitting: Registered Nurse

## 2020-04-13 LAB — RAPID URINE DRUG SCREEN, HOSP PERFORMED
Amphetamines: POSITIVE — AB
Barbiturates: NOT DETECTED
Benzodiazepines: NOT DETECTED
Cocaine: NOT DETECTED
Opiates: NOT DETECTED
Tetrahydrocannabinol: NOT DETECTED

## 2020-04-13 LAB — SARS CORONAVIRUS 2 BY RT PCR (HOSPITAL ORDER, PERFORMED IN ~~LOC~~ HOSPITAL LAB): SARS Coronavirus 2: NEGATIVE

## 2020-04-13 MED ORDER — LORAZEPAM 2 MG/ML IJ SOLN
1.0000 mg | Freq: Once | INTRAMUSCULAR | Status: AC
  Administered 2020-04-13: 1 mg via INTRAMUSCULAR
  Filled 2020-04-13: qty 1

## 2020-04-13 MED ORDER — ACETAMINOPHEN 325 MG PO TABS
650.0000 mg | ORAL_TABLET | Freq: Once | ORAL | Status: AC
Start: 2020-04-13 — End: 2020-04-13
  Administered 2020-04-13: 650 mg via ORAL
  Filled 2020-04-13: qty 2

## 2020-04-13 MED ORDER — LORAZEPAM 1 MG PO TABS
1.0000 mg | ORAL_TABLET | Freq: Once | ORAL | Status: AC
  Administered 2020-04-13: 1 mg via ORAL
  Filled 2020-04-13: qty 1

## 2020-04-13 NOTE — ED Notes (Signed)
Urine to lab
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
## 2020-04-13 NOTE — ED Notes (Signed)
Getting tts 

## 2020-04-13 NOTE — ED Notes (Signed)
Pt up to desk to use phone. Pt became agitated, slammed phone down and went into room where she shut her door. Pt informed her door would have to stay open or the lights would need to be turned on.

## 2020-04-13 NOTE — Progress Notes (Signed)
I-stat hCG, quantitative <5 mIU/mL <5.0   Comment 3       Comment: GEST. AGE   CONC. (mIU/mL)   <=1 WEEK    5 - 50    2 WEEKS    50 - 500    3 WEEKS    100 - 10,000    4 WEEKS   1,000 - 30,000      FEMALE AND NON-PREGNANT FEMALE:    LESS THAN 5 mIU/mL    CSW received a call from [facility] intake with tentative acceptance of pt once they receive:  -Negative Covid results -Confirmation that pt is not pregant (see above) -IVC paperwork--CSW spoke with pt's RN, Crystal who faxed IVC paperwork directly to Admissions/Intake at [phone]. CSW is faxing the following note with negative pregnancy screen and negative covid results.   PT ACCEPTED TO [facility] PER [facility] IN ADMISSIONS FOR Monday, 7/26 (ANYTIME AFTER 8:00am) TO [facility] [address] [facility]. NUMBER FOR REPORT: [phone]. ACCEPTING PROVIDER: DR. Marliss Czar. CSW notified Statistician of disposition.    Selena Wang, MSW, LCSW Clinical Social Worker 04/13/2020 10:59 AM

## 2020-04-13 NOTE — ED Provider Notes (Addendum)
Emergency Medicine Observation Re-evaluation Note  Selena Wang is a 34 y.o. female, seen on rounds today.  Pt initially presented to the ED for complaints of Homicidal Currently, the patient is pacing around psychiatric unit very upset with her situation requesting that her IVC be rescinded so she can leave.  Patient was medically cleared by previous team she is here for homicidal ideations under IVC.  Awaiting psychiatric disposition at this time.  Physical Exam  BP (!) 105/62 (BP Location: Left Arm)   Pulse 99   Temp 98.3 F (36.8 C) (Oral)   Resp 18   Ht 5\' 2"  (1.575 m)   Wt 52.2 kg   SpO2 100%   BMI 21.03 kg/m  Physical Exam Constitutional:      General: She is not in acute distress.    Appearance: Normal appearance. She is well-developed. She is not ill-appearing or diaphoretic.  HENT:     Head: Normocephalic and atraumatic.  Eyes:     General: Vision grossly intact. Gaze aligned appropriately.     Pupils: Pupils are equal, round, and reactive to light.  Neck:     Trachea: Trachea and phonation normal.  Pulmonary:     Effort: Pulmonary effort is normal. No respiratory distress.  Musculoskeletal:        General: Normal range of motion.     Cervical back: Normal range of motion.  Skin:    General: Skin is warm and dry.  Neurological:     Mental Status: She is alert.     GCS: GCS eye subscore is 4. GCS verbal subscore is 5. GCS motor subscore is 6.     Comments: Speech is clear and goal oriented, follows commands Major Cranial nerves without deficit, no facial droop Moves extremities without ataxia, coordination intact Normal gait  Psychiatric:        Mood and Affect: Affect is angry and tearful.        Behavior: Behavior is agitated.     ED Course / MDM  EKG:    I have reviewed the labs performed to date as well as medications administered while in observation.  Recent changes in the last 24 hours include, reviewed labs obtained yesterday.  Significant for  a negative pregnancy test.  Ethanol level negative.  Nonspecific leukocytosis on CBC of 12.6, no anemia.  CMP shows no emergent electrolyte derangement, AKI, LFT elevation or gap.  Covid test negative.  UDS positive for amphetamines.  Vital signs obtained around 7 AM this morning are unremarkable. Plan  Current plan is for psychiatric disposition.  During my evaluation patient is very upset arguing with nursing staff.  She was given 1 mg intramuscular Ativan.  We will continue to be observed by nursing staff, remains medically cleared.    Note: Portions of this report may have been transcribed using voice recognition software. Every effort was made to ensure accuracy; however, inadvertent computerized transcription errors may still be present.    04/13/20 1422    04/15/20, MD 04/14/20 [id number]

## 2020-04-13 NOTE — ED Notes (Signed)
Patient is having an emotional breakdown and currently crying with the sitter; EDP notified for medication for sleep per patient request-Monique,RN

## 2020-04-13 NOTE — ED Notes (Signed)
Pt continues to have outbursts of anger and crying. ED PA to the bedside to assess pt.

## 2020-04-13 NOTE — ED Notes (Signed)
PT requesting medication to help her sleep. Attempted to call [facility] NP, with no success. MD Cobos paged.

## 2020-04-13 NOTE — Progress Notes (Addendum)
Patient meets criteria for inpatient treatment. There are no available beds at [facility] today. CSW faxed referrals to the following facilities for review:  [facility]--Shante from admissions called [phone] have a bed available for pt tomorrow, 7/26. SHe is calling pt's RN to ask a few questions. [facility]  TTS will continue to seek bed placement.   Trula Slade, MSW, LCSW Clinical Social Worker 04/13/2020 9:32 AM

## 2020-04-13 NOTE — ED Notes (Signed)
Pt's dinner ordered °

## 2020-04-13 NOTE — ED Notes (Signed)
ED Provider at bedside. 

## 2020-04-13 NOTE — ED Provider Notes (Signed)
  RN contacted me stating patient is becoming more anxious, pacing in her room.  Patient requesting something for sleep and anxiety.  States she is very anxious about being here.  She is also worried about her children because she is away from them.  She also complains of a headache consistent with previous headaches.  Denies other complaints or symptoms.  Patient does appear anxious, however, is able to carry on a conversation.    Anselm Pancoast, PA-C 04/13/20 2230    Virgina Norfolk, DO 04/13/20 2300

## 2020-04-14 NOTE — ED Notes (Signed)
Pt changed into blue scrubs and updated her mother

## 2020-04-14 NOTE — ED Provider Notes (Signed)
Emergency Medicine Observation Re-evaluation Note  Takirah Binford is a 34 y.o. female, seen on rounds today.  Pt initially presented to the ED for complaints of Homicidal Currently, the patient is alert and cooperative.  She wonders why she has to be hospitalized to get medicines for psychiatric purposes..  Physical Exam  BP (!) 97/64 (BP Location: Left Arm)   Pulse (!) 110   Temp 98.5 F (36.9 C) (Oral)   Resp 15   Ht 5\' 2"  (1.575 m)   Wt 52.2 kg   SpO2 100%   BMI 21.03 kg/m  Physical Exam Calm, cooperative and interactive.  No dysarthria or aphasia. ED Course / MDM  EKG:    I have reviewed the labs performed to date as well as medications administered while in observation.  Recent changes in the last 24 hours include she remained stable, and has been accepted for treatment at a outlying psychiatric facility. Plan  Current plan is for transfer by [facility]. Patient is under full IVC at this time.   [facility], MD 04/14/20 [phone]

## 2020-04-14 NOTE — ED Notes (Signed)
RN attempted to give report x 2. RN was getting report and then passing meds. Sheriff called for ride.

## 2021-01-03 ENCOUNTER — Emergency Department (HOSPITAL_COMMUNITY)
Admission: EM | Admit: 2021-01-03 | Discharge: 2021-01-04 | Payer: Medicaid Other | Attending: Emergency Medicine | Admitting: Emergency Medicine

## 2021-01-03 ENCOUNTER — Encounter (HOSPITAL_COMMUNITY): Payer: Self-pay

## 2021-01-03 ENCOUNTER — Other Ambulatory Visit: Payer: Self-pay

## 2021-01-03 DIAGNOSIS — R45851 Suicidal ideations: Secondary | ICD-10-CM | POA: Insufficient documentation

## 2021-01-03 DIAGNOSIS — Z20822 Contact with and (suspected) exposure to covid-19: Secondary | ICD-10-CM | POA: Diagnosis not present

## 2021-01-03 DIAGNOSIS — Z046 Encounter for general psychiatric examination, requested by authority: Secondary | ICD-10-CM | POA: Diagnosis not present

## 2021-01-03 DIAGNOSIS — R456 Violent behavior: Secondary | ICD-10-CM | POA: Diagnosis present

## 2021-01-03 DIAGNOSIS — F332 Major depressive disorder, recurrent severe without psychotic features: Secondary | ICD-10-CM | POA: Diagnosis not present

## 2021-01-03 DIAGNOSIS — R45850 Homicidal ideations: Secondary | ICD-10-CM | POA: Diagnosis not present

## 2021-01-03 DIAGNOSIS — F17210 Nicotine dependence, cigarettes, uncomplicated: Secondary | ICD-10-CM | POA: Insufficient documentation

## 2021-01-03 LAB — COMPREHENSIVE METABOLIC PANEL
ALT: 12 U/L (ref 0–44)
AST: 13 U/L — ABNORMAL LOW (ref 15–41)
Albumin: 4.3 g/dL (ref 3.5–5.0)
Alkaline Phosphatase: 65 U/L (ref 38–126)
Anion gap: 6 (ref 5–15)
BUN: 9 mg/dL (ref 6–20)
CO2: 24 mmol/L (ref 22–32)
Calcium: 9.1 mg/dL (ref 8.9–10.3)
Chloride: 106 mmol/L (ref 98–111)
Creatinine, Ser: 0.71 mg/dL (ref 0.44–1.00)
GFR, Estimated: >60 mL/min (ref >60)
Glucose, Bld: 106 mg/dL — ABNORMAL HIGH (ref 70–99)
Potassium: 3.3 mmol/L — ABNORMAL LOW (ref 3.5–5.1)
Sodium: 136 mmol/L (ref 135–145)
Total Bilirubin: 0.6 mg/dL (ref 0.3–1.2)
Total Protein: 6.4 g/dL — ABNORMAL LOW (ref 6.5–8.1)

## 2021-01-03 LAB — CBC WITH DIFFERENTIAL/PLATELET
Abs Immature Granulocytes: 0.03 10*3/uL (ref 0.00–0.07)
Basophils Absolute: 0.1 10*3/uL (ref 0.0–0.1)
Basophils Relative: 1 %
Eosinophils Absolute: 0.4 10*3/uL (ref 0.0–0.5)
Eosinophils Relative: 5 %
HCT: 41.6 % (ref 36.0–46.0)
Hemoglobin: 14 g/dL (ref 12.0–15.0)
Immature Granulocytes: 0 %
Lymphocytes Relative: 23 %
Lymphs Abs: 2.2 10*3/uL (ref 0.7–4.0)
MCH: 30.6 pg (ref 26.0–34.0)
MCHC: 33.7 g/dL (ref 30.0–36.0)
MCV: 90.8 fL (ref 80.0–100.0)
Monocytes Absolute: 0.5 10*3/uL (ref 0.1–1.0)
Monocytes Relative: 5 %
Neutro Abs: 6.2 10*3/uL (ref 1.7–7.7)
Neutrophils Relative %: 66 %
Platelets: 227 10*3/uL (ref 150–400)
RBC: 4.58 MIL/uL (ref 3.87–5.11)
RDW: 11.7 % (ref 11.5–15.5)
WBC: 9.4 10*3/uL (ref 4.0–10.5)
nRBC: 0 % (ref 0.0–0.2)

## 2021-01-03 LAB — ETHANOL: Alcohol, Ethyl (B): <10 mg/dL (ref <10)

## 2021-01-03 LAB — SALICYLATE LEVEL: Salicylate Lvl: <7 mg/dL — ABNORMAL LOW (ref 7.0–30.0)

## 2021-01-03 LAB — ACETAMINOPHEN LEVEL: Acetaminophen (Tylenol), Serum: 38 ug/mL — ABNORMAL HIGH (ref 10–30)

## 2021-01-03 NOTE — ED Triage Notes (Signed)
Pt states that she has not taken any medications in months.

## 2021-01-03 NOTE — ED Triage Notes (Signed)
Emergency Medicine Provider Triage Evaluation Note  Selena Wang , a 35 y.o. female  was evaluated in triage.  Pt presents to ED under IVC for concerns of harming herself, aggressive behavior, noncompliance with medications. States she is only here because her mother does not want her to be with her daughter on Easter.  Review of Systems  Positive: suicidal Negative: Homicidal  Physical Exam  There were no vitals taken for this visit. Gen:   Awake, no distress   HEENT:  Atraumatic  Resp:  Normal effort  Cardiac:  Normal rate  Abd:   Nondistended, nontender  MSK:   Moves extremities without difficulty  Neuro:  Speech clear   Medical Decision Making  Medically screening exam initiated at 8:45 PM.  Appropriate orders placed.  Selena Wang was informed that the remainder of the evaluation will be completed by another provider, this initial triage assessment does not replace that evaluation, and the importance of remaining in the ED until their evaluation is complete.  Clinical Impression  Obtain medical screening lab work.   Selena Pates, PA-C 01/03/21 2049

## 2021-01-03 NOTE — BH Assessment (Signed)
Per Cala Bradford, RN pt is currently in triage and not in a room. Clinician asked RN if she can call once pt is placed in a room. Pt's TTS assessment will be completed once placed in a room.    Redmond Pulling, MS, [facility] Triage Specialist [phone]

## 2021-01-03 NOTE — ED Triage Notes (Signed)
Pt brought in by PD under IVC - Petition states that she has been aggressive, making homicidal threats to family, and stated she wanted to die. Pt states "My daughter has leukemia and my mom is a bitch." Pt tearful in triage.

## 2021-01-04 ENCOUNTER — Ambulatory Visit (HOSPITAL_COMMUNITY)
Admission: EM | Admit: 2021-01-04 | Discharge: 2021-01-04 | Disposition: A | Discharge status: Home / Self Care | Payer: Medicaid Other | Source: Home / Self Care

## 2021-01-04 ENCOUNTER — Other Ambulatory Visit: Payer: Self-pay

## 2021-01-04 DIAGNOSIS — Z6379 Other stressful life events affecting family and household: Secondary | ICD-10-CM | POA: Insufficient documentation

## 2021-01-04 DIAGNOSIS — Z818 Family history of other mental and behavioral disorders: Secondary | ICD-10-CM | POA: Insufficient documentation

## 2021-01-04 DIAGNOSIS — F320 Major depressive disorder, single episode, mild: Secondary | ICD-10-CM

## 2021-01-04 DIAGNOSIS — F332 Major depressive disorder, recurrent severe without psychotic features: Secondary | ICD-10-CM | POA: Insufficient documentation

## 2021-01-04 LAB — RESP PANEL BY RT-PCR (FLU A&B, COVID) ARPGX2
Influenza A by PCR: NEGATIVE
Influenza B by PCR: NEGATIVE
SARS Coronavirus 2 by RT PCR: NEGATIVE

## 2021-01-04 LAB — ACETAMINOPHEN LEVEL: Acetaminophen (Tylenol), Serum: <10 ug/mL — ABNORMAL LOW (ref 10–30)

## 2021-01-04 MED ORDER — NICOTINE 21 MG/24HR TD PT24
21.0000 mg | MEDICATED_PATCH | Freq: Every day | TRANSDERMAL | Status: DC
  Administered 2021-01-04: 21 mg via TRANSDERMAL
  Filled 2021-01-04: qty 1

## 2021-01-04 MED ORDER — ONDANSETRON HCL 4 MG PO TABS: 4.0000 mg | ORAL_TABLET | Freq: Three times a day (TID) | ORAL | Status: DC | PRN

## 2021-01-04 MED ORDER — IBUPROFEN 400 MG PO TABS: 600.0000 mg | ORAL_TABLET | Freq: Three times a day (TID) | ORAL | Status: DC | PRN

## 2021-01-04 MED ORDER — ALUM & MAG HYDROXIDE-SIMETH 200-200-20 MG/5ML PO SUSP: 30.0000 mL | Freq: Four times a day (QID) | ORAL | Status: DC | PRN

## 2021-01-04 MED ORDER — ARIPIPRAZOLE 5 MG PO TABS: 5.0000 mg | ORAL_TABLET | Freq: Once | ORAL | Status: DC

## 2021-01-04 MED ORDER — ARIPIPRAZOLE 5 MG PO TABS: 5.0000 mg | ORAL_TABLET | Freq: Once | ORAL | 0 refills | Status: AC

## 2021-01-04 NOTE — Discharge Instructions (Signed)
Take all medications as prescribed. Keep all follow-up appointments as scheduled.  Do not consume alcohol or use illegal drugs while on prescription medications. Report any adverse effects from your medications to your primary care provider promptly.  In the event of recurrent symptoms or worsening symptoms, call 911, a crisis hotline, or go to the nearest emergency department for evaluation.   

## 2021-01-04 NOTE — ED Notes (Signed)
Pt wanded by security. Belongings inventoried and placed in locker #11.

## 2021-01-04 NOTE — ED Provider Notes (Signed)
[facility] EMERGENCY DEPARTMENT Provider Note   CSN: [id number] Arrival date & time: 01/03/21  2027     History Chief Complaint  Patient presents with  . Mental Health Problem    Selena Wang is a 35 y.o. female with a history of anxiety who presents to the emergency department under IVC.  Per IVC paperwork, there were concerns that the patient was going to harm herself and that she had been aggressive and making homicidal threats toward family.  On my evaluation, the patient states that she is here because she and her mother got into an argument.  She denies SI, HI, or auditory visual hallucinations.  She denies chest pain, shortness of breath, nausea, vomiting, diarrhea, fever, chills, numbness, or weakness.  She reports that she took 3 tablets of Tylenol yesterday afternoon for a headache.  She reports that her headache is resolved.  She denies alcohol use.  No illicit or recreational substance use.  She denies any chronic medical conditions.  Per chart review, patient had similar IVC in July 2021.  The history is provided by the patient and medical records. No language interpreter was used.       Past Medical History:  Diagnosis Date  . Abnormal Pap smear   . Anxiety   . Depression   . Headache   . Infection    UTI  . Vaginal Pap smear, abnormal    colpo    Patient Active Problem List   Diagnosis Date Noted  . Evaluate anatomy not seen on prior sonogram   . [redacted] weeks gestation of pregnancy   . Encounter for fetal anatomic survey   . [redacted] weeks gestation of pregnancy   . CIN I (cervical intraepithelial neoplasia I) 09/07/2013    Past Surgical History:  Procedure Laterality Date  . INDUCED ABORTION    . WISDOM TOOTH EXTRACTION     age 34     OB History    Gravida  5   Para  1   Term  1   Preterm  0   AB  3   Living  2     SAB  2   IAB  1   Ectopic  0   Multiple  0   Live Births  1           Family History   Problem Relation Age of Onset  . Depression Mother   . Thyroid disease Mother   . Anxiety disorder Mother   . Hypertension Father   . Depression Father   . Anxiety disorder Father   . Depression Sister   . Anxiety disorder Sister   . Hearing loss Maternal Grandmother        age related  . Cancer Paternal Grandmother     Social History   Tobacco Use  . Smoking status: Current Every Day Smoker    Packs/day: 1.00    Years: 12.00    Pack years: 12.00    Types: Cigarettes  . Smokeless tobacco: Never Used  Substance Use Topics  . Alcohol use: No    Comment: occassion  . Drug use: No    Home Medications Prior to Admission medications   Medication Sig Start Date End Date Taking? Authorizing Provider  acetaminophen (TYLENOL) 500 MG tablet Take 1,500 mg by mouth daily as needed for headache.    Yes [provider]  levonorgestrel (MIRENA) 20 MCG/24HR IUD 1 each by Intrauterine route once. Implanted fall 2016  Yes [provider]    Allergies    Adhesive [tape]  Review of Systems   Review of Systems  Constitutional: Negative for activity change, chills, diaphoresis and fever.  HENT: Negative for congestion.   Respiratory: Negative for shortness of breath.   Cardiovascular: Negative for chest pain and palpitations.  Gastrointestinal: Negative for abdominal pain, diarrhea, nausea and vomiting.  Genitourinary: Negative for dysuria.  Musculoskeletal: Negative for back pain, joint swelling, myalgias and neck stiffness.  Skin: Negative for rash.  Allergic/Immunologic: Negative for immunocompromised state.  Neurological: Negative for syncope, weakness and headaches.  Psychiatric/Behavioral: Negative for confusion, hallucinations, self-injury and suicidal ideas.   Physical Exam Updated Vital Signs BP 92/68 (BP Location: Right Arm)   Pulse 76   Temp 98.3 F (36.8 C) (Oral)   Resp 17   Ht 5\' 2"  (1.575 m)   Wt 52.2 kg   LMP  (LMP Unknown)   SpO2 98%    BMI 21.05 kg/m   Physical Exam Vitals and nursing note reviewed.  Constitutional:      General: She is not in acute distress.    Comments: No acute distress.  HENT:     Head: Normocephalic.  Eyes:     Conjunctiva/sclera: Conjunctivae normal.  Cardiovascular:     Rate and Rhythm: Normal rate and regular rhythm.     Heart sounds: No murmur heard. No friction rub. No gallop.   Pulmonary:     Effort: Pulmonary effort is normal.  Abdominal:     General: There is no distension.     Palpations: Abdomen is soft.     Tenderness: There is no abdominal tenderness.  Musculoskeletal:     Cervical back: Neck supple.  Skin:    General: Skin is warm.     Findings: No rash.  Neurological:     Mental Status: She is alert.  Psychiatric:        Attention and Perception: She does not perceive auditory or visual hallucinations.        Speech: Speech normal.        Behavior: Behavior normal.        Thought Content: Thought content does not include homicidal or suicidal ideation. Thought content does not include homicidal or suicidal plan.     ED Results / Procedures / Treatments   Labs (all labs ordered are listed, but only abnormal results are displayed) Labs Reviewed  COMPREHENSIVE METABOLIC PANEL - Abnormal; Notable for the following components:      Result Value   Potassium 3.3 (*)    Glucose, Bld 106 (*)    Total Protein 6.4 (*)    AST 13 (*)    All other components within normal limits  SALICYLATE LEVEL - Abnormal; Notable for the following components:   Salicylate Lvl <7.0 (*)    All other components within normal limits  ACETAMINOPHEN LEVEL - Abnormal; Notable for the following components:   Acetaminophen (Tylenol), Serum 38 (*)    All other components within normal limits  ACETAMINOPHEN LEVEL - Abnormal; Notable for the following components:   Acetaminophen (Tylenol), Serum <10 (*)    All other components within normal limits  RESP PANEL BY RT-PCR (FLU A&B, COVID) ARPGX2   ETHANOL  CBC WITH DIFFERENTIAL/PLATELET  RAPID URINE DRUG SCREEN, HOSP PERFORMED  I-STAT BETA HCG BLOOD, ED ([facility], [facility], [facility] ONLY)    EKG None  Radiology No results found.  Procedures Procedures   Medications Ordered in ED Medications  ondansetron (ZOFRAN) tablet 4  mg (has no administration in time range)  alum & mag hydroxide-simeth (MAALOX/MYLANTA) 200-200-20 MG/5ML suspension 30 mL (has no administration in time range)  nicotine (NICODERM CQ - dosed in mg/24 hours) patch 21 mg (has no administration in time range)  ibuprofen (ADVIL) tablet 600 mg (has no administration in time range)    ED Course  I have reviewed the triage vital signs and the nursing notes.  Pertinent labs & imaging results that were available during my care of the patient were reviewed by me and considered in my medical decision making (see chart for details).    MDM Rules/Calculators/A&P                          35 year old female with history of anxiety brought in under IVC for homicidal ideation and aggression towards family and suicidal ideation Per IVC paperwork.  Vital signs are stable.  First examination was completed by Dr. Preston Fleeting, attending physician.  Patient has no other complaints at this time.  Labs been reviewed and independently interpreted by me. Initial Tylenol level is 38.  I discussed this with the patient.  She does report that she took 3 tablets of Tylenol for headache earlier in the day.  Headache is since resolved.  She denies taking any other over-the-counter medications containing acetaminophen.  She denies intentional overdose.  Repeat Tylenol level is less than 10.  UDS is pending.  However, she is medically cleared at this time.   Psych hold orders and home med orders placed. TTS consult pending; please see psych team notes for further documentation of care/dispo. Pt stable at time of med clearance.     Final Clinical Impression(s) / ED Diagnoses Final diagnoses:  Homicidal  ideation  Suicidal ideation    Rx / DC Orders ED Discharge Orders    None       Barkley Boards, PA-C 01/04/21 0841    Dione Booze, MD 01/04/21 2247
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
## 2021-01-04 NOTE — ED Notes (Signed)
Pt transferred to facility via [employer] from [facility] with complaints reporting pt having SI/HI toward family. Pt states, "I'm not feeling suicidal or homicidal. My daughter was diagnosed with Leukemia on Mar 27th. She is going thru chemotherapy now and I was feeling overwhelmed and scared at the same time. She is only 35 yo. Her father is trying to get custody of her but we all stay with my parents. I wasn't going to hurt anybody and definitely not myself. I just want to get home to my daughter. I'm fine". Pt speaking with NP currently.

## 2021-01-04 NOTE — ED Notes (Signed)
Patient calling her family at this time to update them that she is being admitted to [facility]. Attempted to call report at this time and no answer.

## 2021-01-04 NOTE — ED Notes (Signed)
RN was just informed by NP that IVC has been rescinded. Awaiting orders

## 2021-01-04 NOTE — ED Notes (Signed)
Discharge instructions provided and Pt stated understanding. Personal belongings returned from locker. Pt alert, orient and ambulatory. Pt escorted to the front lobby. Safety maintained.  

## 2021-01-04 NOTE — ED Notes (Signed)
Patient ambulatory and alert. Patient updated on status, awaiting TTS and plan for disposition. Patient is

## 2021-01-04 NOTE — ED Provider Notes (Addendum)
Behavioral Health Urgent Care Medical Screening Exam  Patient Name: Selena Wang MRN: [id number] Date of Evaluation: 01/04/21 Chief Complaint:   Diagnosis:  Final diagnoses:  Current mild episode of major depressive disorder, unspecified whether recurrent (HCC)    History of Present illness: Selena Wang is a 35 y.o. female presents to [facility] urgent care.  Safe transport due to involuntary commitment by significant other.  She denied suicidal or homicidal ideations.  Denies auditory or visual hallucinations.  Selena Wang does admit to depression due to multiple stressors.  States her 59-year-old daughter was recently diagnosed with leukemia.  Reports a strained relationship between she and her mother.  She reports mother has a diagnosis with bipolar depression.  Selena Wang denied illicit drug use/substance abuse history.  Selena Wang reported history with mental health however states she is unsure of her current diagnoses.  States she was prescribed Cymbalta, Zoloft Abilify and Prozac previously.  Denies that she is currently taking medications.  States she has a follow-up appointment on Jan 22, 2021 [facility].  Discussed starting partial hospitalization programming.  Will initiate Abilify 5 mg p.o. daily.  Patient was receptive to plan.   Collateral: NP spoke to patient's mother April Marut  at [phone] who denied any safety concerns with patient returning home.  Reports patient was involved in a verbal altercation between she and the child's father.  Who also resides in the home as well. IVC was rescinded. Support, encouragement and reassurance  was provided.   Psychiatric Specialty Exam  Presentation  General Appearance:Appropriate for Environment  Eye Contact:Good  Speech:Normal Rate; Clear and Coherent  Speech Volume:Normal  Handedness:Right   Mood and Affect  Mood:Anxious; Depressed  Affect:Congruent   Thought Process   Thought Processes:Coherent  Descriptions of Associations:Intact  Orientation:Full (Time, Place and Person)  Thought Content:Logical  Diagnosis of Schizophrenia or Schizoaffective disorder in past: No   Hallucinations:None  Ideas of Reference:None  Suicidal Thoughts:No  Homicidal Thoughts:No   Sensorium  Memory:Immediate Fair; Recent Fair; Remote Fair  Judgment:Intact  Insight:Good   Executive Functions  Concentration:Fair  Attention Span:Fair  Recall:Good  Fund of Knowledge:Good  Language:Good   Psychomotor Activity  Psychomotor Activity:Normal   Assets  Assets:Desire for Improvement; Social Support   Sleep  Sleep:Fair  Number of hours: No data recorded  Nutritional Assessment (For OBS and FBC admissions only) Has the patient had a weight loss or gain of 10 pounds or more in the last 3 months?: No Has the patient had a decrease in food intake/or appetite?: No Does the patient have dental problems?: No Does the patient have eating habits or behaviors that may be indicators of an eating disorder including binging or inducing vomiting?: No Has the patient recently lost weight without trying?: No Has the patient been eating poorly because of a decreased appetite?: No Malnutrition Screening Tool Score: 0    Physical Exam: Physical Exam Vitals reviewed.  Cardiovascular:     Rate and Rhythm: Normal rate and regular rhythm.  Psychiatric:        Attention and Perception: Attention normal.        Mood and Affect: Mood normal.        Speech: Speech normal.        Behavior: Behavior normal.        Thought Content: Thought content normal.        Cognition and Memory: Cognition normal.        Judgment: Judgment normal.    Review of Systems  Psychiatric/Behavioral: Positive for depression. Negative for hallucinations and suicidal ideas. The patient is nervous/anxious.   All other systems reviewed and are negative.  Blood pressure 92/68, pulse (!)  111, temperature 97.8 F (36.6 C), temperature source Oral, resp. rate 18, SpO2 100 %, unknown if currently breastfeeding. There is no height or weight on file to calculate BMI.  Musculoskeletal: Strength & Muscle Tone: within normal limits Gait & Station: normal Patient leans: N/A   [facility] MSE Discharge Disposition for Follow up and Recommendations: Based on my evaluation the patient does not appear to have an emergency medical condition and can be discharged with resources and follow up care in outpatient services for Medication Management, Partial Hospitalization Program, Individual Therapy and Group Therapy   Selena Rack, NP 01/04/2021, 4:48 PM

## 2021-01-04 NOTE — ED Notes (Signed)
Pt discharged in no acute distress. Verbalized understanding of AVS instructions reviewed by staff. Pt escorted to lobby for family to transport her to home. Safety maintained.

## 2022-11-18 ENCOUNTER — Emergency Department (HOSPITAL_BASED_OUTPATIENT_CLINIC_OR_DEPARTMENT_OTHER)
Admission: EM | Admit: 2022-11-18 | Discharge: 2022-11-18 | Disposition: A | Discharge status: Home / Self Care | Payer: Self-pay | Attending: Emergency Medicine | Admitting: Emergency Medicine

## 2022-11-18 ENCOUNTER — Encounter (HOSPITAL_BASED_OUTPATIENT_CLINIC_OR_DEPARTMENT_OTHER): Payer: Self-pay

## 2022-11-18 ENCOUNTER — Emergency Department (HOSPITAL_BASED_OUTPATIENT_CLINIC_OR_DEPARTMENT_OTHER): Payer: Self-pay

## 2022-11-18 ENCOUNTER — Other Ambulatory Visit: Payer: Self-pay

## 2022-11-18 DIAGNOSIS — B9789 Other viral agents as the cause of diseases classified elsewhere: Secondary | ICD-10-CM | POA: Insufficient documentation

## 2022-11-18 DIAGNOSIS — J069 Acute upper respiratory infection, unspecified: Secondary | ICD-10-CM | POA: Insufficient documentation

## 2022-11-18 DIAGNOSIS — Z20822 Contact with and (suspected) exposure to covid-19: Secondary | ICD-10-CM | POA: Insufficient documentation

## 2022-11-18 LAB — RESP PANEL BY RT-PCR (RSV, FLU A&B, COVID)  RVPGX2
Influenza A by PCR: NEGATIVE
Influenza B by PCR: NEGATIVE
Resp Syncytial Virus by PCR: NEGATIVE
SARS Coronavirus 2 by RT PCR: NEGATIVE

## 2022-11-18 NOTE — ED Provider Notes (Signed)
   [place]  Provider Note  CSN: [id number] Arrival date & time: 11/18/22 2149  History Chief Complaint  Patient presents with   Cough    Selena Wang is a 37 y.o. female with no significant PMH reports several hours of cough, congestion, nasal drainage and chest soreness with coughing. No fever. She had Covid about a month ago. Her 54 year old daughter has leukemia and is scheduled for bone marrow transplant next week, so she was concerned about being contagious.    Home Medications Prior to Admission medications   Medication Sig Start Date End Date Taking? Authorizing Provider  acetaminophen (TYLENOL) 500 MG tablet Take 1,500 mg by mouth daily as needed for headache.     [provider]  ARIPiprazole (ABILIFY) 5 MG tablet Take 1 tablet (5 mg total) by mouth once for 1 dose. 01/04/21 01/04/21  Derrill Center, NP  levonorgestrel (MIRENA) 20 MCG/24HR IUD 1 each by Intrauterine route once. Implanted fall 2016    [provider]     Allergies    Adhesive [tape]   Review of Systems   Review of Systems Please see HPI for pertinent positives and negatives  Physical Exam BP 128/87   Pulse (!) 103   Temp 98.4 F (36.9 C) (Oral)   Resp 18   Ht 5' 2"$  (1.575 m)   Wt 59 kg   SpO2 100%   Breastfeeding No   BMI 23.78 kg/m   Physical Exam Vitals and nursing note reviewed.  HENT:     Head: Normocephalic.     Nose: Nose normal.  Eyes:     Extraocular Movements: Extraocular movements intact.  Pulmonary:     Effort: Pulmonary effort is normal.  Musculoskeletal:        General: Normal range of motion.     Cervical back: Neck supple.  Skin:    Findings: No rash (on exposed skin).  Neurological:     Mental Status: She is alert and oriented to person, place, and time.  Psychiatric:        Mood and Affect: Mood normal.     ED Results / Procedures / Treatments    EKG None  Procedures Procedures  Medications Ordered in the ED Medications - No data to display  Initial Impression and Plan  Patient here with mild URI symptoms, exam is reassuring. Her primary concern is being contagious around her daughter before and during upcoming bone marrow transplant. She had negative Covid/Flu/RSV swab and I personally viewed the images from radiology studies and agree with radiologist interpretation: CXR is clear. Advised likely a nonspecific viral illness but could be contagious for daughter, recommend isolation, masking, hand washing to the extent possible at home and discuss with her daughter's transplant team tomorrow regarding their recommendations. Patient advised OTC symptomatic care as needed for her symptoms.   ED Course       MDM Rules/Calculators/A&P Medical Decision Making Problems Addressed: Viral URI with cough: acute illness or injury  Amount and/or Complexity of Data Reviewed Labs: ordered. Decision-making details documented in ED Course. Radiology: ordered and independent interpretation performed. Decision-making details documented in ED Course.  Risk OTC drugs.     Final Clinical Impression(s) / ED Diagnoses Final diagnoses:  Viral URI with cough    Rx / DC Orders ED Discharge Orders     None        Truddie Hidden, MD 11/18/22 2341

## 2022-11-18 NOTE — ED Triage Notes (Signed)
POV from home, a&O x 4, gcs 15, amb to triage  Cough that began today, productive, nasal drainage, sts pain when coughing or trying to take deep breath. Had covid 1 month ago.

## 2022-11-18 NOTE — Discharge Instructions (Addendum)
Please isolate from your daughter as much a possible in the next few days. Wear a mask in the house or car at all times. Wash hands frequently. Discuss your illness with your Daughter's transplant team tomorrow.
